# Patient Record
Sex: Female | Born: 1975 | Race: Black or African American | Hispanic: No | Marital: Single | Smoking: Never smoker
Health system: Southern US, Community
[De-identification: ages and names within clinical notes are randomized; demographics above are authoritative.]

## PROBLEM LIST (undated history)

## (undated) DIAGNOSIS — F909 Attention-deficit hyperactivity disorder, unspecified type: Secondary | ICD-10-CM

## (undated) DIAGNOSIS — F329 Major depressive disorder, single episode, unspecified: Secondary | ICD-10-CM

## (undated) DIAGNOSIS — Z87440 Personal history of urinary (tract) infections: Secondary | ICD-10-CM

## (undated) DIAGNOSIS — B009 Herpesviral infection, unspecified: Secondary | ICD-10-CM

## (undated) DIAGNOSIS — E7800 Pure hypercholesterolemia, unspecified: Secondary | ICD-10-CM

## (undated) DIAGNOSIS — N760 Acute vaginitis: Secondary | ICD-10-CM

## (undated) DIAGNOSIS — N3010 Interstitial cystitis (chronic) without hematuria: Secondary | ICD-10-CM

## (undated) DIAGNOSIS — F32A Depression, unspecified: Secondary | ICD-10-CM

## (undated) DIAGNOSIS — IMO0002 Reserved for concepts with insufficient information to code with codable children: Secondary | ICD-10-CM

## (undated) DIAGNOSIS — J45909 Unspecified asthma, uncomplicated: Secondary | ICD-10-CM

## (undated) DIAGNOSIS — G43909 Migraine, unspecified, not intractable, without status migrainosus: Secondary | ICD-10-CM

## (undated) DIAGNOSIS — K5909 Other constipation: Secondary | ICD-10-CM

## (undated) DIAGNOSIS — K219 Gastro-esophageal reflux disease without esophagitis: Secondary | ICD-10-CM

## (undated) DIAGNOSIS — B9689 Other specified bacterial agents as the cause of diseases classified elsewhere: Secondary | ICD-10-CM

## (undated) DIAGNOSIS — L42 Pityriasis rosea: Secondary | ICD-10-CM

## (undated) DIAGNOSIS — F419 Anxiety disorder, unspecified: Secondary | ICD-10-CM

## (undated) DIAGNOSIS — R87619 Unspecified abnormal cytological findings in specimens from cervix uteri: Secondary | ICD-10-CM

## (undated) DIAGNOSIS — F429 Obsessive-compulsive disorder, unspecified: Secondary | ICD-10-CM

## (undated) DIAGNOSIS — E119 Type 2 diabetes mellitus without complications: Secondary | ICD-10-CM

## (undated) HISTORY — DX: Unspecified abnormal cytological findings in specimens from cervix uteri: R87.619

## (undated) HISTORY — DX: Depression, unspecified: F32.A

## (undated) HISTORY — DX: Other specified bacterial agents as the cause of diseases classified elsewhere: N76.0

## (undated) HISTORY — PX: OTHER SURGICAL HISTORY: SHX169

## (undated) HISTORY — PX: ESOPHAGEAL DILATION: SHX303

## (undated) HISTORY — DX: Interstitial cystitis (chronic) without hematuria: N30.10

## (undated) HISTORY — DX: Herpesviral infection, unspecified: B00.9

## (undated) HISTORY — DX: Other specified bacterial agents as the cause of diseases classified elsewhere: B96.89

## (undated) HISTORY — DX: Pityriasis rosea: L42

## (undated) HISTORY — DX: Personal history of urinary (tract) infections: Z87.440

## (undated) HISTORY — DX: Type 2 diabetes mellitus without complications: E11.9

## (undated) HISTORY — DX: Reserved for concepts with insufficient information to code with codable children: IMO0002

## (undated) HISTORY — DX: Major depressive disorder, single episode, unspecified: F32.9

## (undated) HISTORY — DX: Pure hypercholesterolemia, unspecified: E78.00

---

## 2001-02-23 ENCOUNTER — Encounter: Payer: Self-pay | Admitting: Unknown Physician Specialty

## 2001-02-23 ENCOUNTER — Ambulatory Visit (HOSPITAL_COMMUNITY): Admission: RE | Admit: 2001-02-23 | Discharge: 2001-02-23 | Payer: Self-pay | Admitting: Unknown Physician Specialty

## 2001-03-31 ENCOUNTER — Emergency Department (HOSPITAL_COMMUNITY): Admission: EM | Admit: 2001-03-31 | Discharge: 2001-03-31 | Payer: Self-pay | Admitting: Emergency Medicine

## 2001-04-26 ENCOUNTER — Emergency Department (HOSPITAL_COMMUNITY): Admission: EM | Admit: 2001-04-26 | Discharge: 2001-04-26 | Payer: Self-pay | Admitting: Emergency Medicine

## 2001-04-26 ENCOUNTER — Encounter: Payer: Self-pay | Admitting: Emergency Medicine

## 2001-05-14 ENCOUNTER — Encounter: Payer: Self-pay | Admitting: Pain Medicine

## 2001-05-14 ENCOUNTER — Ambulatory Visit (HOSPITAL_COMMUNITY): Admission: RE | Admit: 2001-05-14 | Discharge: 2001-05-14 | Payer: Self-pay | Admitting: Pain Medicine

## 2001-11-28 ENCOUNTER — Emergency Department (HOSPITAL_COMMUNITY): Admission: EM | Admit: 2001-11-28 | Discharge: 2001-11-29 | Payer: Self-pay | Admitting: *Deleted

## 2002-12-18 ENCOUNTER — Emergency Department (HOSPITAL_COMMUNITY): Admission: EM | Admit: 2002-12-18 | Discharge: 2002-12-18 | Payer: Self-pay | Admitting: Emergency Medicine

## 2002-12-21 ENCOUNTER — Ambulatory Visit (HOSPITAL_COMMUNITY): Admission: RE | Admit: 2002-12-21 | Discharge: 2002-12-21 | Payer: Self-pay | Admitting: *Deleted

## 2003-06-28 ENCOUNTER — Emergency Department (HOSPITAL_COMMUNITY): Admission: EM | Admit: 2003-06-28 | Discharge: 2003-06-28 | Payer: Self-pay | Admitting: Emergency Medicine

## 2003-06-28 ENCOUNTER — Encounter: Payer: Self-pay | Admitting: *Deleted

## 2003-10-30 ENCOUNTER — Ambulatory Visit (HOSPITAL_COMMUNITY): Admission: AD | Admit: 2003-10-30 | Discharge: 2003-10-30 | Payer: Self-pay | Admitting: Obstetrics & Gynecology

## 2003-12-15 ENCOUNTER — Ambulatory Visit (HOSPITAL_COMMUNITY): Admission: AD | Admit: 2003-12-15 | Discharge: 2003-12-15 | Payer: Self-pay | Admitting: Obstetrics & Gynecology

## 2004-02-20 ENCOUNTER — Ambulatory Visit (HOSPITAL_COMMUNITY): Admission: RE | Admit: 2004-02-20 | Discharge: 2004-02-20 | Payer: Self-pay | Admitting: Obstetrics and Gynecology

## 2004-02-28 ENCOUNTER — Ambulatory Visit (HOSPITAL_COMMUNITY): Admission: AD | Admit: 2004-02-28 | Discharge: 2004-02-28 | Payer: Self-pay | Admitting: Obstetrics and Gynecology

## 2004-03-07 ENCOUNTER — Inpatient Hospital Stay (HOSPITAL_COMMUNITY): Admission: AD | Admit: 2004-03-07 | Discharge: 2004-03-10 | Payer: Self-pay | Admitting: Obstetrics and Gynecology

## 2004-12-07 ENCOUNTER — Ambulatory Visit (HOSPITAL_COMMUNITY): Admission: RE | Admit: 2004-12-07 | Discharge: 2004-12-07 | Payer: Self-pay | Admitting: Family Medicine

## 2006-09-27 ENCOUNTER — Emergency Department (HOSPITAL_COMMUNITY): Admission: EM | Admit: 2006-09-27 | Discharge: 2006-09-27 | Payer: Self-pay | Admitting: Emergency Medicine

## 2006-12-15 ENCOUNTER — Emergency Department (HOSPITAL_COMMUNITY): Admission: EM | Admit: 2006-12-15 | Discharge: 2006-12-15 | Payer: Self-pay | Admitting: Emergency Medicine

## 2006-12-23 ENCOUNTER — Ambulatory Visit (HOSPITAL_COMMUNITY): Admission: RE | Admit: 2006-12-23 | Discharge: 2006-12-23 | Payer: Self-pay | Admitting: Family Medicine

## 2008-01-04 ENCOUNTER — Other Ambulatory Visit: Admission: RE | Admit: 2008-01-04 | Discharge: 2008-01-04 | Payer: Self-pay | Admitting: Obstetrics & Gynecology

## 2008-12-26 ENCOUNTER — Other Ambulatory Visit: Admission: RE | Admit: 2008-12-26 | Discharge: 2008-12-26 | Payer: Self-pay | Admitting: Obstetrics & Gynecology

## 2009-08-28 ENCOUNTER — Ambulatory Visit (HOSPITAL_COMMUNITY): Admission: RE | Admit: 2009-08-28 | Discharge: 2009-08-28 | Payer: Self-pay | Admitting: Family Medicine

## 2009-12-19 ENCOUNTER — Encounter: Payer: Self-pay | Admitting: Orthopedic Surgery

## 2009-12-19 ENCOUNTER — Emergency Department (HOSPITAL_COMMUNITY): Admission: EM | Admit: 2009-12-19 | Discharge: 2009-12-19 | Payer: Self-pay | Admitting: Emergency Medicine

## 2009-12-26 ENCOUNTER — Ambulatory Visit: Payer: Self-pay | Admitting: Orthopedic Surgery

## 2009-12-26 DIAGNOSIS — IMO0002 Reserved for concepts with insufficient information to code with codable children: Secondary | ICD-10-CM | POA: Insufficient documentation

## 2009-12-26 DIAGNOSIS — M25539 Pain in unspecified wrist: Secondary | ICD-10-CM | POA: Insufficient documentation

## 2009-12-29 ENCOUNTER — Encounter (INDEPENDENT_AMBULATORY_CARE_PROVIDER_SITE_OTHER): Payer: Self-pay | Admitting: *Deleted

## 2010-01-03 ENCOUNTER — Ambulatory Visit (HOSPITAL_COMMUNITY): Admission: RE | Admit: 2010-01-03 | Discharge: 2010-01-03 | Payer: Self-pay | Admitting: Orthopedic Surgery

## 2010-01-09 ENCOUNTER — Ambulatory Visit: Payer: Self-pay | Admitting: Orthopedic Surgery

## 2010-01-22 ENCOUNTER — Ambulatory Visit: Payer: Self-pay | Admitting: Orthopedic Surgery

## 2010-01-22 ENCOUNTER — Telehealth: Payer: Self-pay | Admitting: Orthopedic Surgery

## 2010-01-22 DIAGNOSIS — M25569 Pain in unspecified knee: Secondary | ICD-10-CM

## 2010-01-30 ENCOUNTER — Encounter: Payer: Self-pay | Admitting: Orthopedic Surgery

## 2010-01-30 ENCOUNTER — Encounter (HOSPITAL_COMMUNITY)
Admission: RE | Admit: 2010-01-30 | Discharge: 2010-03-01 | Payer: Self-pay | Source: Home / Self Care | Admitting: Orthopedic Surgery

## 2010-03-12 ENCOUNTER — Telehealth: Payer: Self-pay | Admitting: Orthopedic Surgery

## 2010-04-19 ENCOUNTER — Encounter (HOSPITAL_COMMUNITY): Admission: RE | Admit: 2010-04-19 | Discharge: 2010-05-19 | Payer: Self-pay | Admitting: Orthopedic Surgery

## 2010-05-02 ENCOUNTER — Encounter: Payer: Self-pay | Admitting: Orthopedic Surgery

## 2010-05-15 ENCOUNTER — Other Ambulatory Visit: Admission: RE | Admit: 2010-05-15 | Discharge: 2010-05-15 | Payer: Self-pay | Admitting: Obstetrics & Gynecology

## 2010-05-18 ENCOUNTER — Encounter: Payer: Self-pay | Admitting: Orthopedic Surgery

## 2010-05-22 ENCOUNTER — Encounter (HOSPITAL_COMMUNITY)
Admission: RE | Admit: 2010-05-22 | Discharge: 2010-06-21 | Payer: Self-pay | Source: Home / Self Care | Admitting: Orthopedic Surgery

## 2010-06-19 ENCOUNTER — Encounter: Payer: Self-pay | Admitting: Orthopedic Surgery

## 2010-06-20 ENCOUNTER — Telehealth: Payer: Self-pay | Admitting: Orthopedic Surgery

## 2010-06-22 ENCOUNTER — Encounter (HOSPITAL_COMMUNITY)
Admission: RE | Admit: 2010-06-22 | Discharge: 2010-07-22 | Payer: Self-pay | Source: Home / Self Care | Admitting: Orthopedic Surgery

## 2010-07-26 ENCOUNTER — Encounter: Payer: Self-pay | Admitting: Orthopedic Surgery

## 2010-08-22 ENCOUNTER — Ambulatory Visit (HOSPITAL_COMMUNITY): Admission: RE | Admit: 2010-08-22 | Discharge: 2010-08-22 | Payer: Self-pay | Admitting: Obstetrics & Gynecology

## 2010-08-29 ENCOUNTER — Telehealth: Payer: Self-pay | Admitting: Orthopedic Surgery

## 2010-11-18 ENCOUNTER — Encounter: Payer: Self-pay | Admitting: Orthopedic Surgery

## 2010-11-27 NOTE — Progress Notes (Signed)
Summary: vicodin refill  Phone Note Call from Patient Call back at [facility] Phone [phone]   Summary of Call: wants refill on Vicodin 5, ok or not, has not been seen since March, was supposed to come back, did not, just wanted to run by you Initial call taken by: Ether Griffins,  June 20, 2010 8:34 AM  Follow-up for Phone Call        refill #42 2 refills Follow-up by: Fuller Canada MD,  June 20, 2010 8:36 AM    Prescriptions: VICODIN 5-500 MG TABS (HYDROCODONE-ACETAMINOPHEN) 1/2 to 1 by mouth q 4 as needed pain  #42 x 2   Entered by:   Ether Griffins   Authorized by:   Fuller Canada MD   Signed by:   Ether Griffins on 06/20/2010   Method used:   Handwritten   RxID:   [id number]

## 2010-11-27 NOTE — Assessment & Plan Note (Signed)
Summary: RE-CK KNEE IN BRACE/MEDICAID/CAF   Visit Type:  Follow-up Referring Provider:  emergency room  CC:  left knee and left wrist.  History of Present Illness: I saw Debra Lewis in the office today for a 1 month  followup visit.  She is a 35 years old woman with the complaint of:  left knee and left wrist pain.  MEDS:  1/2 vicodin zanaflex aleve   IMAGES: MRI 1.  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 2.   Otherwise, no significant abnormality identified.  Braces have not been fitting, We've tried several   she has some crepitance in the knee, which really bothers her. I had her walk down the hall. She has no subluxation of patella, statically, or dynamically, but the crepitance is making her feel like she does. Her knee is completely stable. She has some anterior knee pain. There is no swelling.  Recommend dry knee sleeve, which we did that seemed to fit better.  Recommend physical therapy for 6 weeks and recheck.  Allergies: 1)  ! Sulfa 2)  ! * Fagel   Other Orders: Physical Therapy Referral (PT) Est. Patient Level II (19147)  Patient Instructions: 1)  Physical therapy for 6 weeks 2)  continue bracing while walking 3)  come back in 6 weeks

## 2010-11-27 NOTE — Miscellaneous (Signed)
Summary: PT initial evaluation  PT initial evaluation   Imported By: Jacklynn Ganong 05/07/2010 08:14:34  _____________________________________________________________________  External Attachment:    Type:   Image     Comment:   External Document

## 2010-11-27 NOTE — Progress Notes (Signed)
Summary: sleeve is too big  Phone Note Call from Patient   Summary of Call: Debra Lewis (20-Oct-1976) said she pulled the medium sleeve all the way up on her thigh and when she walks it slides down. Her # [phone] Initial call taken by: Jacklynn Ganong,  January 22, 2010 2:39 PM  Follow-up for Phone Call        come in and try the smaller one  Follow-up by: Fuller Canada MD,  January 22, 2010 2:43 PM  Additional Follow-up for Phone Call Additional follow up Details #1::        Advised patient to come in this afternoon to try smaller sleeve Additional Follow-up by: Jacklynn Ganong,  January 22, 2010 3:03 PM

## 2010-11-27 NOTE — Progress Notes (Signed)
Summary: patient to call back to re-schedule appointment  Phone Note Call from Patient   Caller: Patient Summary of Call: Patient has called needing to re-schedule her fol/up appt after completing physical therapy (previously scheduled 10/17,10/24,/08/27/10), first 2 which had to be re-scheduled due to son's illness, then re-sched'd d/t a surgical procedure she has just recently had.  She will call to re-schedule asap. Initial call taken by: Cammie Sickle,  August 29, 2010 10:11 AM
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
## 2010-11-27 NOTE — Letter (Signed)
Summary: Medical record request Atty R.Clyda Hurdle  Medical record request Atty R.Clyda Hurdle   Imported By: Cammie Sickle 05/22/2010 17:59:02  _____________________________________________________________________  External Attachment:    Type:   Image     Comment:   External Document

## 2010-11-27 NOTE — Letter (Signed)
Summary: History form  History form   Imported By: Jacklynn Ganong 01/02/2010 09:54:46  _____________________________________________________________________  External Attachment:    Type:   Image     Comment:   External Document

## 2010-11-27 NOTE — Miscellaneous (Signed)
Summary: PT Progress note  PT Progress note   Imported By: Jacklynn Ganong 06/19/2010 11:27:00  _____________________________________________________________________  External Attachment:    Type:   Image     Comment:   External Document

## 2010-11-27 NOTE — Miscellaneous (Signed)
Summary: mri aph 01/03/10 940am left knee  Clinical Lists Changes   Medicaid [place] precert number [id number] expires 01/28/10, aph mri left knee, to come back 01/09/10 for results in our office, to bring disc.

## 2010-11-27 NOTE — Miscellaneous (Signed)
Summary: PT Initial evaluation  PT Initial evaluation   Imported By: Jacklynn Ganong 02/13/2010 09:11:38  _____________________________________________________________________  External Attachment:    Type:   Image     Comment:   External Document
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
## 2010-11-27 NOTE — Miscellaneous (Signed)
Summary: PT Discharge summary  PT Discharge summary   Imported By: Jacklynn Ganong 07/30/2010 08:14:26  _____________________________________________________________________  External Attachment:    Type:   Image     Comment:   External Document

## 2010-11-27 NOTE — Progress Notes (Signed)
Summary: cancelled today's app't  Phone Note Call from Patient   Summary of Call: Debra Lewis cancelled today's appointment due to her son having pneumonia.  He has been in the hospital and is home now but still very sick.  Said she will reschedule when her son is better.  Has not been able to go to PT, but has spoken with them and the PT has been put on hold until after she sees you again.  Said she has been doing the exercises at home. Initial call taken by: Jacklynn Ganong,  Mar 12, 2010 8:12 AM

## 2011-01-09 LAB — COMPREHENSIVE METABOLIC PANEL
BUN: 4 mg/dL — ABNORMAL LOW (ref 6–23)
CO2: 25 mEq/L (ref 19–32)
Calcium: 9.1 mg/dL (ref 8.4–10.5)
GFR calc Af Amer: >60 mL/min (ref >60)
GFR calc non Af Amer: >60 mL/min (ref >60)
Glucose, Bld: 90 mg/dL (ref 70–99)
Potassium: 4.7 mEq/L (ref 3.5–5.1)
Sodium: 139 mEq/L (ref 135–145)
Total Bilirubin: 0.4 mg/dL (ref 0.3–1.2)

## 2011-01-09 LAB — URINALYSIS, ROUTINE W REFLEX MICROSCOPIC
Glucose, UA: NEGATIVE mg/dL
Hgb urine dipstick: NEGATIVE
Ketones, ur: NEGATIVE mg/dL
Protein, ur: NEGATIVE mg/dL

## 2011-01-09 LAB — CBC
Hemoglobin: 12.3 g/dL (ref 12.0–15.0)
MCH: 27 pg (ref 26.0–34.0)
Platelets: 341 10*3/uL (ref 150–400)
RBC: 4.54 MIL/uL (ref 3.87–5.11)
RDW: 15.5 % (ref 11.5–15.5)
WBC: 7.3 10*3/uL (ref 4.0–10.5)

## 2011-01-09 LAB — HCG, QUANTITATIVE, PREGNANCY: hCG, Beta Chain, Quant, S: <2 m[IU]/mL (ref <5)

## 2011-03-15 NOTE — Op Note (Signed)
NAME:  [name], [name]                        ACCOUNT NO.:  [id number]   MEDICAL RECORD NO.:  [id number]                   PATIENT TYPE:  OIB   LOCATION:  A428                                 FACILITY:  [facility]   PHYSICIAN:  Tilda Burrow, M.D.              DATE OF BIRTH:  06/05/1976   DATE OF PROCEDURE:  DATE OF DISCHARGE:  02/28/2004                                 OPERATIVE REPORT   [name] progressed nicely in labor.  The epidural was placed earlier in the  labor process.  She reached 7 cm rather quickly.  As we noted earlier, the  epidural catheter placement was somewhat challenging.  She had cessation of  epidural catheter benefits, received an additional bolus of 7 cc of 0.125%  Marcaine on two consecutive doses, approximately 5 minutes apart.  She did  not get any additional relief.  She reached completed dilated, moving  quickly from 7 cm.  Upon beginning to push, there was a brief second stage  notable initially for fetal bradycardia.  We were able to assist with  rotating the vertex manually and it rotated into the right occiput anterior  position and delivered over a small second degree laceration secondary to  episiotomy in the midline, delivering a 6 pound 1.1 ounce female infant,  Apgars 9 and 9, with amniotic fluid clear, bulb suctioning of the pharynx  performed before delivering the rest of the body and easy delivery of the  infant.   The cord was clamped after the infant was placed on the maternal abdomen.  The infant was then placed in the warmer.  The placenta delivered  subsequently.  The cord began to separate from the placenta so we were able  to use ring forceps to grab the leading edge of the placenta.  Once the  placenta was delivered, we were able to invert the placenta and recognize  that she had a bilateral-valved placenta with two separate lobes of the  placenta, each one greater than 10 cm in diameter with a velamentous  insertion of cord and several  arteries and veins traversing across the large  space between the two placental bodies.  One of these large veins and  adjacent artery ran within 2 cm of the opening in the membrane where the  membranes had ruptured.  There were no major vessels that could be  identified and reached at the opening in the placental membrane.  We showed  this to the family.  The placenta will be sent for histology.  The mother  and infant did well with excellent support with three support persons in the  room.      ___________________________________________                                            Debra Ruiz  Benancio Lewis, M.D.   JVF/MEDQ  D:  03/07/2004  T:  03/08/2004  Job:  [id number]   cc:   Joette Catching, M.D.

## 2011-03-15 NOTE — Consult Note (Signed)
NAME:  Debra, Lewis                        ACCOUNT NO.:  [id number]   MEDICAL RECORD NO.:  [id number]                   PATIENT TYPE:  OIB   LOCATION:  A418                                 FACILITY:  [facility]   PHYSICIAN:  Lazaro Arms, M.D.                DATE OF BIRTH:  1975-11-18   DATE OF CONSULTATION:  DATE OF DISCHARGE:  02/20/2004                                   CONSULTATION   Debra Lewis is a 35 year old, gravida 1, para 0, at [redacted] weeks gestation who  presented to labor and delivery complaining of lower abdominal pain and  contractions. She is having infrequent contractions, maybe about three in 45  minutes. Her cervix is long, thick, and closed, posterior and soft. She has  reactive NST. She has had no bleeding and no gushes of fluid.   IMPRESSION:  This represents false labor. She is discharged to home. She is  to keep her regular  appointment and given aftercare instructions.      ___________________________________________                                            Lazaro Arms, M.D.   Loraine Maple  D:  02/20/2004  T:  02/21/2004  Job:  [id number]
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
## 2011-03-15 NOTE — Op Note (Signed)
NAME:  Debra Lewis, Debra Lewis                        ACCOUNT NO.:  [id number]   MEDICAL RECORD NO.:  [id number]                   PATIENT TYPE:  OIB   LOCATION:  A428                                 FACILITY:  [facility]   PHYSICIAN:  Tilda Burrow, M.D.              DATE OF BIRTH:  1976/09/02   DATE OF PROCEDURE:  DATE OF DISCHARGE:  02/28/2004                                 OPERATIVE REPORT   PROCEDURE:  Epidural catheter placement.   DESCRIPTION OF PROCEDURE:  A continuous epidural catheter was placed in the  L2-3 interspace using loss-of-resistance technique.  After prepping and  draping in the bed, we were able to identify the interspace and were able to  gradually advance the needle.  There was some bony contact but we were able  to ease the needle in at a distance of about 8 cm.  Even though there was  not a distinct pop through the interspinous ligament, we were able to  identify what appeared to be loss-of-resistance.  There was no fluid  aspirated so the catheter was advanced to a distance of 4 cm into the  epidural space and a 5 mL test dose of 1.5% Xylocaine with epinephrine  instilled and then the catheter was taped to the back and attached to  position a bolus of 10 mL of the Marcaine solution.  The patient had  symmetric increase in sensation in her thighs and hips and contractions  became uncomfortable.  A 212 level was noted.  The patient did not have any  hypotensive episodes and will be monitored for continued progress with  Pitocin augmentation of labor in place at 4 munits at this time.      ___________________________________________                                            Tilda Burrow, M.D.   JVF/MEDQ  D:  03/07/2004  T:  03/07/2004  Job:  [id number]

## 2011-11-13 ENCOUNTER — Ambulatory Visit: Payer: Self-pay | Admitting: Orthopedic Surgery

## 2011-12-19 ENCOUNTER — Encounter: Payer: Self-pay | Admitting: Orthopedic Surgery

## 2011-12-19 ENCOUNTER — Ambulatory Visit (INDEPENDENT_AMBULATORY_CARE_PROVIDER_SITE_OTHER): Payer: Medicaid Other | Admitting: Orthopedic Surgery

## 2011-12-19 DIAGNOSIS — Z87828 Personal history of other (healed) physical injury and trauma: Secondary | ICD-10-CM

## 2011-12-19 DIAGNOSIS — M25569 Pain in unspecified knee: Secondary | ICD-10-CM

## 2011-12-19 DIAGNOSIS — Z8739 Personal history of other diseases of the musculoskeletal system and connective tissue: Secondary | ICD-10-CM

## 2011-12-19 NOTE — Patient Instructions (Signed)
Sign release to lawyer
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
## 2013-02-13 ENCOUNTER — Encounter (HOSPITAL_COMMUNITY): Payer: Self-pay | Admitting: Emergency Medicine

## 2013-02-13 ENCOUNTER — Emergency Department (HOSPITAL_COMMUNITY): Payer: Medicaid Other

## 2013-02-13 ENCOUNTER — Emergency Department (HOSPITAL_COMMUNITY)
Admission: EM | Admit: 2013-02-13 | Discharge: 2013-02-14 | Disposition: A | Discharge status: Home / Self Care | Payer: Medicaid Other | Attending: Emergency Medicine | Admitting: Emergency Medicine

## 2013-02-13 DIAGNOSIS — R209 Unspecified disturbances of skin sensation: Secondary | ICD-10-CM | POA: Insufficient documentation

## 2013-02-13 DIAGNOSIS — R0789 Other chest pain: Secondary | ICD-10-CM

## 2013-02-13 DIAGNOSIS — F909 Attention-deficit hyperactivity disorder, unspecified type: Secondary | ICD-10-CM | POA: Insufficient documentation

## 2013-02-13 DIAGNOSIS — J45909 Unspecified asthma, uncomplicated: Secondary | ICD-10-CM | POA: Insufficient documentation

## 2013-02-13 DIAGNOSIS — Z8742 Personal history of other diseases of the female genital tract: Secondary | ICD-10-CM | POA: Insufficient documentation

## 2013-02-13 DIAGNOSIS — Z87440 Personal history of urinary (tract) infections: Secondary | ICD-10-CM | POA: Insufficient documentation

## 2013-02-13 DIAGNOSIS — R071 Chest pain on breathing: Secondary | ICD-10-CM | POA: Insufficient documentation

## 2013-02-13 DIAGNOSIS — F429 Obsessive-compulsive disorder, unspecified: Secondary | ICD-10-CM | POA: Insufficient documentation

## 2013-02-13 DIAGNOSIS — R51 Headache: Secondary | ICD-10-CM | POA: Insufficient documentation

## 2013-02-13 DIAGNOSIS — Z8619 Personal history of other infectious and parasitic diseases: Secondary | ICD-10-CM | POA: Insufficient documentation

## 2013-02-13 DIAGNOSIS — M62838 Other muscle spasm: Secondary | ICD-10-CM | POA: Insufficient documentation

## 2013-02-13 DIAGNOSIS — Z79899 Other long term (current) drug therapy: Secondary | ICD-10-CM | POA: Insufficient documentation

## 2013-02-13 DIAGNOSIS — R61 Generalized hyperhidrosis: Secondary | ICD-10-CM | POA: Insufficient documentation

## 2013-02-13 DIAGNOSIS — H53149 Visual discomfort, unspecified: Secondary | ICD-10-CM | POA: Insufficient documentation

## 2013-02-13 DIAGNOSIS — Z8679 Personal history of other diseases of the circulatory system: Secondary | ICD-10-CM | POA: Insufficient documentation

## 2013-02-13 DIAGNOSIS — F411 Generalized anxiety disorder: Secondary | ICD-10-CM | POA: Insufficient documentation

## 2013-02-13 HISTORY — DX: Attention-deficit hyperactivity disorder, unspecified type: F90.9

## 2013-02-13 HISTORY — DX: Unspecified asthma, uncomplicated: J45.909

## 2013-02-13 HISTORY — DX: Obsessive-compulsive disorder, unspecified: F42.9

## 2013-02-13 HISTORY — DX: Migraine, unspecified, not intractable, without status migrainosus: G43.909

## 2013-02-13 HISTORY — DX: Anxiety disorder, unspecified: F41.9

## 2013-02-13 LAB — BASIC METABOLIC PANEL
BUN: 7 mg/dL (ref 6–23)
Chloride: 104 mEq/L (ref 96–112)
Creatinine, Ser: 0.85 mg/dL (ref 0.50–1.10)
GFR calc Af Amer: >90 mL/min (ref >90)
Glucose, Bld: 83 mg/dL (ref 70–99)

## 2013-02-13 LAB — CBC
HCT: 40.8 % (ref 36.0–46.0)
Hemoglobin: 13.7 g/dL (ref 12.0–15.0)
MCHC: 33.6 g/dL (ref 30.0–36.0)
MCV: 82.8 fL (ref 78.0–100.0)
RDW: 14.5 % (ref 11.5–15.5)
WBC: 8.8 10*3/uL (ref 4.0–10.5)

## 2013-02-13 MED ORDER — IBUPROFEN 600 MG PO TABS: 600.0000 mg | ORAL_TABLET | Freq: Four times a day (QID) | ORAL | Status: DC | PRN

## 2013-02-13 MED ORDER — CYCLOBENZAPRINE HCL 5 MG PO TABS: 5.0000 mg | ORAL_TABLET | Freq: Three times a day (TID) | ORAL | Status: DC | PRN

## 2013-02-13 MED ORDER — METOCLOPRAMIDE HCL 5 MG/ML IJ SOLN
10.0000 mg | Freq: Once | INTRAMUSCULAR | Status: AC
  Administered 2013-02-13: 10 mg via INTRAVENOUS
  Filled 2013-02-13: qty 2

## 2013-02-13 MED ORDER — METHOCARBAMOL 500 MG PO TABS
1000.0000 mg | ORAL_TABLET | Freq: Once | ORAL | Status: AC
  Administered 2013-02-13: 1000 mg via ORAL
  Filled 2013-02-13: qty 2

## 2013-02-13 MED ORDER — KETOROLAC TROMETHAMINE 30 MG/ML IJ SOLN
30.0000 mg | Freq: Once | INTRAMUSCULAR | Status: AC
  Administered 2013-02-13: 30 mg via INTRAVENOUS
  Filled 2013-02-13: qty 1

## 2013-02-13 MED ORDER — DIPHENHYDRAMINE HCL 50 MG/ML IJ SOLN
25.0000 mg | Freq: Once | INTRAMUSCULAR | Status: AC
  Administered 2013-02-13: 25 mg via INTRAVENOUS
  Filled 2013-02-13: qty 1
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
## 2013-02-13 NOTE — ED Notes (Signed)
Patient complaining of central chest pain and right arm numbness and tingling that started today. Reports has had symptoms before.

## 2013-02-16 ENCOUNTER — Encounter: Payer: Self-pay | Admitting: *Deleted

## 2013-02-17 ENCOUNTER — Ambulatory Visit: Payer: Self-pay | Admitting: Obstetrics & Gynecology

## 2013-02-25 ENCOUNTER — Encounter: Payer: Self-pay | Admitting: Obstetrics & Gynecology

## 2013-02-25 ENCOUNTER — Ambulatory Visit (INDEPENDENT_AMBULATORY_CARE_PROVIDER_SITE_OTHER): Payer: Medicaid Other | Admitting: Obstetrics & Gynecology

## 2013-02-25 VITALS — BP 100/80 | Wt 121.0 lb

## 2013-02-25 DIAGNOSIS — N3010 Interstitial cystitis (chronic) without hematuria: Secondary | ICD-10-CM

## 2013-02-25 NOTE — Patient Instructions (Signed)

## 2013-03-03 DIAGNOSIS — N3010 Interstitial cystitis (chronic) without hematuria: Secondary | ICD-10-CM | POA: Insufficient documentation

## 2013-03-03 NOTE — Progress Notes (Signed)
Patient ID: Debra Lewis, female   DOB: 10-24-1976, 37 y.o.   MRN: [id number] In for evaluation and therapy of her IC Last irrigation was 7 weeks age  Bladder been doing ok, no significant complaints  Blood pressure 100/80, weight 121 lb (54.885 kg), last menstrual period 02/03/2013.  Urethra prepped DMSO 50 cc irrigated into bladder without problems  Follow up in 6 weeks

## 2013-04-01 ENCOUNTER — Other Ambulatory Visit: Payer: Self-pay | Admitting: Obstetrics and Gynecology

## 2013-04-16 ENCOUNTER — Ambulatory Visit (INDEPENDENT_AMBULATORY_CARE_PROVIDER_SITE_OTHER): Payer: Medicaid Other | Admitting: Obstetrics & Gynecology

## 2013-04-16 ENCOUNTER — Encounter: Payer: Self-pay | Admitting: Obstetrics & Gynecology

## 2013-04-16 VITALS — BP 110/70 | Temp 98.3°F | Wt 124.0 lb

## 2013-04-16 DIAGNOSIS — R319 Hematuria, unspecified: Secondary | ICD-10-CM

## 2013-04-16 DIAGNOSIS — M549 Dorsalgia, unspecified: Secondary | ICD-10-CM

## 2013-04-16 DIAGNOSIS — N390 Urinary tract infection, site not specified: Secondary | ICD-10-CM | POA: Insufficient documentation

## 2013-04-16 LAB — POCT URINALYSIS DIPSTICK
Blood, UA: 3
Nitrite, UA: NEGATIVE

## 2013-04-16 MED ORDER — CIPROFLOXACIN HCL 500 MG PO TABS: 500.0000 mg | ORAL_TABLET | Freq: Two times a day (BID) | ORAL | Status: DC

## 2013-04-16 NOTE — Progress Notes (Signed)
Patient ID: Debra Lewis, female   DOB: 1976-05-18, 37 y.o.   MRN: [id number] Past Medical History  Diagnosis Date  . Anxiety   . Asthma   . Migraines   . HSV-2 (herpes simplex virus 2) infection   . Abnormal pap   . History of frequent urinary tract infections   . Bacterial vaginosis   . Interstitial cystitis   . OCD (obsessive compulsive disorder)   . ADHD (attention deficit hyperactivity disorder)    Past Surgical History  Procedure Laterality Date  . Conization of cervix     G1P1  ROS negative except per HPI  HPI  [name] is in today complaining of feels like urinary tract symptoms which began last Saturday 6 days ago She then began having lower back and left lower quadrant pain Wednesday She came in today for evaluation Urine is a bit mixed no nitrites little bit of blood microscopically and some white cells  Exam reveals no musculoskeletal pain in the back Pelvic is negative cervical motion tenderness left adnexa slightly tender but normal size moves normally the left ovaries normal right ovaries normal  Impression: Possible UTI in a patient with chronic interstitial cystitis  Plan culture urine Scalpel and give her prescription for Cipro to take with her history I guess is probably most likely scenariobut if does not resolve will proceed with a pelvic sonogram

## 2013-04-16 NOTE — Addendum Note (Signed)
Addended by: Richardson Chiquito on: 04/16/2013 10:10 AM   Modules accepted: Orders

## 2013-04-16 NOTE — Patient Instructions (Addendum)
Urinary Tract Infection  Urinary tract infections (UTIs) can develop anywhere along your urinary tract. Your urinary tract is your body's drainage system for removing wastes and extra water. Your urinary tract includes two kidneys, two ureters, a bladder, and a urethra. Your kidneys are a pair of bean-shaped organs. Each kidney is about the size of your fist. They are located below your ribs, one on each side of your spine.  CAUSES  Infections are caused by microbes, which are microscopic organisms, including fungi, viruses, and bacteria. These organisms are so small that they can only be seen through a microscope. Bacteria are the microbes that most commonly cause UTIs.  SYMPTOMS   Symptoms of UTIs may vary by age and gender of the patient and by the location of the infection. Symptoms in young women typically include a frequent and intense urge to urinate and a painful, burning feeling in the bladder or urethra during urination. Older women and men are more likely to be tired, shaky, and weak and have muscle aches and abdominal pain. A fever may mean the infection is in your kidneys. Other symptoms of a kidney infection include pain in your back or sides below the ribs, nausea, and vomiting.  DIAGNOSIS  To diagnose a UTI, your caregiver will ask you about your symptoms. Your caregiver also will ask to provide a urine sample. The urine sample will be tested for bacteria and white blood cells. White blood cells are made by your body to help fight infection.  TREATMENT   Typically, UTIs can be treated with medication. Because most UTIs are caused by a bacterial infection, they usually can be treated with the use of antibiotics. The choice of antibiotic and length of treatment depend on your symptoms and the type of bacteria causing your infection.  HOME CARE INSTRUCTIONS   If you were prescribed antibiotics, take them exactly as your caregiver instructs you. Finish the medication even if you feel better after you  have only taken some of the medication.   Drink enough water and fluids to keep your urine clear or pale yellow.   Avoid caffeine, tea, and carbonated beverages. They tend to irritate your bladder.   Empty your bladder often. Avoid holding urine for long periods of time.   Empty your bladder before and after sexual intercourse.   After a bowel movement, women should cleanse from front to back. Use each tissue only once.  SEEK MEDICAL CARE IF:    You have back pain.   You develop a fever.   Your symptoms do not begin to resolve within 3 days.  SEEK IMMEDIATE MEDICAL CARE IF:    You have severe back pain or lower abdominal pain.   You develop chills.   You have nausea or vomiting.   You have continued burning or discomfort with urination.  MAKE SURE YOU:    Understand these instructions.   Will watch your condition.   Will get help right away if you are not doing well or get worse.  Document Released: 07/24/2005 Document Revised: 04/14/2012 Document Reviewed: 11/22/2011  ExitCare Patient Information 2014 [employer]

## 2013-04-19 LAB — URINE CULTURE: Colony Count: >=100000

## 2013-04-27 ENCOUNTER — Ambulatory Visit: Payer: Medicaid Other | Admitting: Obstetrics & Gynecology

## 2013-05-11 ENCOUNTER — Encounter: Payer: Self-pay | Admitting: Obstetrics & Gynecology

## 2013-05-11 ENCOUNTER — Ambulatory Visit (INDEPENDENT_AMBULATORY_CARE_PROVIDER_SITE_OTHER): Payer: Medicaid Other | Admitting: Obstetrics & Gynecology

## 2013-05-11 VITALS — BP 120/80 | Wt 123.0 lb

## 2013-05-11 DIAGNOSIS — N3010 Interstitial cystitis (chronic) without hematuria: Secondary | ICD-10-CM

## 2013-05-11 LAB — POCT URINALYSIS DIPSTICK
Blood, UA: NEGATIVE
Glucose, UA: NEGATIVE
Leukocytes, UA: NEGATIVE
Nitrite, UA: NEGATIVE

## 2013-05-11 NOTE — Progress Notes (Signed)
Patient ID: Debra Lewis, female   DOB: 27-Jun-1976, 37 y.o.   MRN: [id number] Debra Lewis is in for treatment for her chronic interstitial cystitis She had DMSO treatments one month ago Her urine today is clear  50 cc DMSO is instilledl there is minimal post what residual  We will see her back in one month for repeat DMSO

## 2013-05-11 NOTE — Addendum Note (Signed)
Addended by: Richardson Chiquito on: 05/11/2013 04:46 PM   Modules accepted: Orders

## 2013-06-10 ENCOUNTER — Ambulatory Visit: Payer: Medicaid Other | Admitting: Obstetrics & Gynecology

## 2013-06-18 ENCOUNTER — Ambulatory Visit: Payer: Medicaid Other | Admitting: Obstetrics & Gynecology

## 2013-06-29 ENCOUNTER — Encounter: Payer: Self-pay | Admitting: Obstetrics & Gynecology

## 2013-06-29 ENCOUNTER — Ambulatory Visit (INDEPENDENT_AMBULATORY_CARE_PROVIDER_SITE_OTHER): Payer: Medicaid Other | Admitting: Obstetrics & Gynecology

## 2013-06-29 VITALS — BP 120/80 | Wt 124.0 lb

## 2013-06-29 DIAGNOSIS — Z3202 Encounter for pregnancy test, result negative: Secondary | ICD-10-CM

## 2013-06-29 DIAGNOSIS — Z3200 Encounter for pregnancy test, result unknown: Secondary | ICD-10-CM

## 2013-06-29 DIAGNOSIS — N3010 Interstitial cystitis (chronic) without hematuria: Secondary | ICD-10-CM

## 2013-06-29 LAB — POCT URINE PREGNANCY: Preg Test, Ur: NEGATIVE

## 2013-06-29 NOTE — Addendum Note (Signed)
Addended by: Richardson Chiquito on: 06/29/2013 03:29 PM   Modules accepted: Orders

## 2013-06-29 NOTE — Progress Notes (Signed)
Patient ID: Debra Lewis, female   DOB: [birth year], 37 y.o.   MRN: [id number] Patient ID: Debra Lewis, female   DOB: October 18, 1976, 37 y.o.   MRN: [id number] Debra Lewis is in for treatment for her chronic interstitial cystitis She had DMSO treatments one month ago Her urine today is clear  50 cc DMSO is instilledl there is minimal post what residual  We will see her back in one month for repeat DMSO

## 2013-07-28 ENCOUNTER — Ambulatory Visit (INDEPENDENT_AMBULATORY_CARE_PROVIDER_SITE_OTHER): Payer: 59 | Admitting: Psychology

## 2013-07-28 DIAGNOSIS — F411 Generalized anxiety disorder: Secondary | ICD-10-CM

## 2013-07-28 DIAGNOSIS — F329 Major depressive disorder, single episode, unspecified: Secondary | ICD-10-CM

## 2013-08-11 ENCOUNTER — Ambulatory Visit (INDEPENDENT_AMBULATORY_CARE_PROVIDER_SITE_OTHER): Payer: 59 | Admitting: Psychology

## 2013-08-11 DIAGNOSIS — F329 Major depressive disorder, single episode, unspecified: Secondary | ICD-10-CM

## 2013-08-11 DIAGNOSIS — F411 Generalized anxiety disorder: Secondary | ICD-10-CM

## 2013-08-12 ENCOUNTER — Encounter (INDEPENDENT_AMBULATORY_CARE_PROVIDER_SITE_OTHER): Payer: Self-pay

## 2013-08-12 ENCOUNTER — Ambulatory Visit (INDEPENDENT_AMBULATORY_CARE_PROVIDER_SITE_OTHER): Payer: Medicaid Other | Admitting: Obstetrics & Gynecology

## 2013-08-12 ENCOUNTER — Encounter: Payer: Self-pay | Admitting: Obstetrics & Gynecology

## 2013-08-12 VITALS — BP 100/72 | Ht 59.0 in | Wt 122.0 lb

## 2013-08-12 DIAGNOSIS — N3010 Interstitial cystitis (chronic) without hematuria: Secondary | ICD-10-CM

## 2013-08-12 DIAGNOSIS — Z3202 Encounter for pregnancy test, result negative: Secondary | ICD-10-CM

## 2013-08-12 NOTE — Progress Notes (Signed)
Patient ID: AGAM TUOHY, female   DOB: 06-19-1976, 37 y.o.   MRN: [id number] Patient ID: ASHANTAE PANGALLO, female   DOB: October 01, 1976, 37 y.o.   MRN: [id number] Patient ID: KAMIKA GOODLOE, female   DOB: February 03, 1976, 37 y.o.   MRN: [id number] [name] is in for treatment for her chronic interstitial cystitis She had DMSO treatments one month ago Her urine today is clear  50 cc DMSO is instilledl there is minimal post what residual  We will see her back in 6 weeks for repeat DMSO

## 2013-08-26 ENCOUNTER — Ambulatory Visit (HOSPITAL_COMMUNITY): Payer: Self-pay | Admitting: Psychology

## 2013-09-07 ENCOUNTER — Ambulatory Visit (INDEPENDENT_AMBULATORY_CARE_PROVIDER_SITE_OTHER): Payer: 59 | Admitting: Psychology

## 2013-09-07 DIAGNOSIS — F3289 Other specified depressive episodes: Secondary | ICD-10-CM

## 2013-09-07 DIAGNOSIS — F329 Major depressive disorder, single episode, unspecified: Secondary | ICD-10-CM

## 2013-09-07 DIAGNOSIS — F411 Generalized anxiety disorder: Secondary | ICD-10-CM

## 2013-09-21 ENCOUNTER — Ambulatory Visit: Payer: Medicaid Other | Admitting: Obstetrics & Gynecology

## 2013-09-29 ENCOUNTER — Other Ambulatory Visit: Payer: Self-pay | Admitting: Obstetrics & Gynecology

## 2013-09-29 ENCOUNTER — Ambulatory Visit (HOSPITAL_COMMUNITY): Payer: Self-pay | Admitting: Psychology

## 2013-10-06 ENCOUNTER — Ambulatory Visit (INDEPENDENT_AMBULATORY_CARE_PROVIDER_SITE_OTHER): Payer: Medicaid Other | Admitting: Obstetrics & Gynecology

## 2013-10-06 ENCOUNTER — Encounter: Payer: Self-pay | Admitting: Obstetrics & Gynecology

## 2013-10-06 VITALS — BP 108/72 | Ht 59.0 in | Wt 122.0 lb

## 2013-10-06 DIAGNOSIS — N3010 Interstitial cystitis (chronic) without hematuria: Secondary | ICD-10-CM

## 2013-10-06 NOTE — Progress Notes (Signed)
Patient ID: Debra Lewis, female   DOB: Oct 22, 1976, 37 y.o.   MRN: [id number] Patient ID: Debra Lewis, female   DOB: [birth year], 37 y.o.   MRN: [id number] Patient ID: Debra Lewis, female   DOB: [birth year], 37 y.o.   MRN: [id number] Patient ID: Debra Lewis, female   DOB: 1976/07/05, 37 y.o.   MRN: [id number] Debra Lewis is in for treatment for her chronic interstitial cystitis She had DMSO treatments one month ago Her urine today is clear  50 cc DMSO is instilledl there is minimal post what residual  We will see her back in 6 weeks for repeat DMSO

## 2013-10-08 ENCOUNTER — Other Ambulatory Visit: Payer: Self-pay | Admitting: Obstetrics & Gynecology

## 2013-10-14 ENCOUNTER — Other Ambulatory Visit: Payer: Self-pay | Admitting: Obstetrics & Gynecology

## 2013-10-15 ENCOUNTER — Ambulatory Visit (HOSPITAL_COMMUNITY): Payer: Self-pay | Admitting: Psychology

## 2013-10-15 MED ORDER — URIBEL 118 MG PO CAPS: 1.0000 | ORAL_CAPSULE | Freq: Four times a day (QID) | ORAL | Status: DC

## 2013-11-17 ENCOUNTER — Encounter (HOSPITAL_COMMUNITY): Payer: Self-pay | Admitting: Psychology

## 2013-11-17 NOTE — Progress Notes (Signed)
Patient:  Debra Lewis   DOB: 10/30/1975  MR Number: [id number]  [facility]Debra Lewis[address] [place] [address] Dept: [phone]  Start: 1 PM End: 2 PM  Provider/Observer:     Hershal CoriaJohn R Treavon Castilleja PSYD  Chief Complaint:      Chief Complaint  Patient presents with  . Anxiety  . Depression  . Stress    Reason For Service:     The patient was referred because of ongoing issues of anxiety and current stressors and depression. The patient reports that she was in a 20 year relationship that recently ended in that she moved back to the area to move in with her parents. The patient reports that there are major stressors due to the fact that her father is a crack cocaine abuser and the patient is in constant state of trying to help him with little success. The patient reports that there is a lot of conflict between her and her ex and they have a son together and the patient reports that her son worries all the time about the situation the patient is in a situation she has to take care of him.   Interventions Strategy:   cognitive/behavioral psychotherapy   Participation Level:   Active  Participation Quality:  Appropriate      Behavioral Observation:  Well Groomed, Alert, and Appropriate.   Current Psychosocial Factors:  The patient reports that she has been coping little bit better with the overall situation that she has been. The patient reports that she has had difficulty with coping recently..   Content of Session:    review current symptoms and worked on therapeutic interventions are in issues of anxiety and depression and coping issues.   Current Status:   patient describes significant symptoms of anxiety, worry, attentional problems, and depression.   Patient Progress:    stable   Target Goals:    target goals include reducing the intensity, severity, and duration of anxiety  symptoms as well as depressive symptoms.   Last Reviewed:    08/11/2013   Goals Addressed Today:    today we worked on Producer, television/film/videobuilding coping skills utilizing cognitive/behavioral therapeutic interventions for issues of depression and anxiety.   Impression/Diagnosis:    the patient has a long history of anxiety and depression as well as attentional problems. She has been taking both antianxiety medications, antidepressant medications as well as psychostimulant medications in the past.   Diagnosis:    Axis I: Generalized anxiety disorder  Depressive disorder, not elsewhere classified

## 2013-11-17 NOTE — Progress Notes (Signed)
Patient:  Debra Lewis   DOB: 09/28/1976  MR Number: [id number]  [facility] [address] [place] [address] Dept: [phone]  Start: 11 AM End: 12 PM  Provider/Observer:     Hershal CoriaJohn R Rodenbough PSYD  Chief Complaint:      Chief Complaint  Patient presents with  . Depression  . Anxiety  . Stress    Reason For Service:     The patient was referred because of ongoing issues of anxiety and current stressors and depression. The patient reports that she was in a 20 year relationship that recently ended in that she moved back to the area to move in with her parents. The patient reports that there are major stressors due to the fact that her father is a crack cocaine abuser and the patient is in constant state of trying to help him with little success. The patient reports that there is a lot of conflict between her and her ex and they have a son together and the patient reports that her son worries all the time about the situation the patient is in a situation she has to take care of him.   Interventions Strategy:   cognitive/behavioral psychotherapy   Participation Level:   Active  Participation Quality:  Appropriate      Behavioral Observation:  Well Groomed, Alert, and Appropriate.   Current Psychosocial Factors:  The patient reports that she has been coping a little bit better recently and has been actively working on therapeutic interventions particular round psychosocial stressors..   Content of Session:    review current symptoms and worked on therapeutic interventions are in issues of anxiety and depression and coping issues.   Current Status:   The patient reports some improvement in her overall coping skills and that she has been experiencing improvement in her symptoms of anxiety and depression..   Patient Progress:    stable   Target Goals:    target goals include  reducing the intensity, severity, and duration of anxiety symptoms as well as depressive symptoms.   Last Reviewed:    09/07/2013   Goals Addressed Today:    today we worked on Producer, television/film/videobuilding coping skills utilizing cognitive/behavioral therapeutic interventions for issues of depression and anxiety.   Impression/Diagnosis:    the patient has a long history of anxiety and depression as well as attentional problems. She has been taking both antianxiety medications, antidepressant medications as well as psychostimulant medications in the past.   Diagnosis:    Axis I: Generalized anxiety disorder  Depressive disorder, not elsewhere classified

## 2013-11-17 NOTE — Progress Notes (Signed)
Patient:  Debra Lewis   DOB: 09/04/1976  MR Number: [id number]  [facility] [address] [place] [id number] Dept: [phone]  Start: 11 AM End: 12 PM  Provider/Observer:     Hershal CoriaJohn R Angeliah Wisdom PSYD  Chief Complaint:      Chief Complaint  Patient presents with  . Depression  . Anxiety    Reason For Service:     The patient was referred because of ongoing issues of anxiety and current stressors and depression. The patient reports that she was in a 20 year relationship that recently ended in that she moved back to the area to move in with her parents. The patient reports that there are major stressors due to the fact that her father is a crack cocaine abuser and the patient is in constant state of trying to help him with little success. The patient reports that there is a lot of conflict between her and her ex and they have a son together and the patient reports that her son worries all the time about the situation the patient is in a situation she has to take care of him.   Interventions Strategy:   cognitive/behavioral psychotherapy   Participation Level:   Active  Participation Quality:  Appropriate      Behavioral Observation:  Well Groomed, Alert, and Appropriate.   Current Psychosocial Factors:  the patient reports that she is struggling after moving back to the area after the breakup of a 20 year relationship and coping with her son.   Content of Session:    review current symptoms and worked on therapeutic interventions are in issues of anxiety and depression and coping issues.   Current Status:   patient describes significant symptoms of anxiety, worry, attentional problems, and depression.   Patient Progress:    stable   Target Goals:    target goals include reducing the intensity, severity, and duration of anxiety symptoms as well as depressive symptoms.   Last  Reviewed:    07/28/2013   Goals Addressed Today:    today we worked on Producer, television/film/videobuilding coping skills utilizing cognitive/behavioral therapeutic interventions for issues of depression and anxiety.   Impression/Diagnosis:    the patient has a long history of anxiety and depression as well as attentional problems. She has been taking both antianxiety medications, antidepressant medications as well as psychostimulant medications in the past.   Diagnosis:    Axis I: Generalized anxiety disorder  Depressive disorder, not elsewhere classified

## 2013-11-23 ENCOUNTER — Ambulatory Visit: Payer: Medicaid Other | Admitting: Obstetrics & Gynecology

## 2013-12-06 ENCOUNTER — Ambulatory Visit: Payer: Medicaid Other | Admitting: Obstetrics & Gynecology

## 2013-12-17 ENCOUNTER — Ambulatory Visit (INDEPENDENT_AMBULATORY_CARE_PROVIDER_SITE_OTHER): Payer: Medicaid Other | Admitting: Obstetrics & Gynecology

## 2013-12-17 ENCOUNTER — Encounter: Payer: Self-pay | Admitting: Obstetrics & Gynecology

## 2013-12-17 VITALS — BP 80/60 | Wt 127.0 lb

## 2013-12-17 DIAGNOSIS — N3010 Interstitial cystitis (chronic) without hematuria: Secondary | ICD-10-CM

## 2013-12-17 NOTE — Progress Notes (Signed)
Patient ID: Debra Lewis, female   DOB: 02/12/1976, 38 y.o.   MRN: [id number] Patient ID: Debra Lewis, female   DOB: 05/27/1976, 38 y.o.   MRN: [id number] Patient ID: Debra Lewis, female   DOB: 11/26/1975, 38 y.o.   MRN: [id number] Patient ID: Debra Lewis, female   DOB: 08/02/1976, 38 y.o.   MRN: [id number] Patient ID: Debra Lewis, female   DOB: 09/06/1976, 38 y.o.   MRN: [id number] Debra Lewis is in for treatment for her chronic interstitial cystitis She had DMSO treatments 10 weeks ago Her urine today is clear  50 cc DMSO is instilledl there is minimal post what residual  We will see her back in 6 weeks for repeat DMSO

## 2014-01-07 ENCOUNTER — Other Ambulatory Visit: Payer: Self-pay | Admitting: Obstetrics & Gynecology

## 2014-01-21 ENCOUNTER — Encounter (HOSPITAL_COMMUNITY): Payer: Self-pay | Admitting: Psychology

## 2014-01-21 ENCOUNTER — Ambulatory Visit (HOSPITAL_COMMUNITY): Payer: Self-pay | Admitting: Psychology

## 2014-01-27 ENCOUNTER — Ambulatory Visit: Payer: Medicaid Other | Admitting: Obstetrics & Gynecology

## 2014-02-04 ENCOUNTER — Ambulatory Visit: Payer: Medicaid Other | Admitting: Obstetrics & Gynecology

## 2014-02-14 ENCOUNTER — Ambulatory Visit (INDEPENDENT_AMBULATORY_CARE_PROVIDER_SITE_OTHER): Payer: Medicaid Other | Admitting: Obstetrics & Gynecology

## 2014-02-14 ENCOUNTER — Encounter: Payer: Self-pay | Admitting: Obstetrics & Gynecology

## 2014-02-14 VITALS — BP 110/70 | Wt 126.0 lb

## 2014-02-14 DIAGNOSIS — N3010 Interstitial cystitis (chronic) without hematuria: Secondary | ICD-10-CM

## 2014-02-14 NOTE — Progress Notes (Signed)
Patient ID: Debra Lewis, female   DOB: 12/16/1975, 38 y.o.   MRN: [id number] Patient ID: Debra Lewis, female   DOB: 01/02/1976, 38 y.o.   MRN: [id number] Patient ID: Debra Lewis, female   DOB: 10/24/1976, 38 y.o.   MRN: [id number] Patient ID: Debra Lewis, female   DOB: 11/02/1975, 38 y.o.   MRN: [id number] Patient ID: Debra Lewis, female   DOB: 10/02/1976, 38 y.o.   MRN: [id number] Patient ID: Debra Lewis, female   DOB: 08/30/1976, 38 y.o.   MRN: [id number] Debra Lewis is in for treatment for her chronic interstitial cystitis She had DMSO treatments 10 weeks ago Her urine today is clear  50 cc DMSO is instilledl there is minimal post what residual  We will Lewis her back in 6 weeks for repeat DMSO  Past Medical History  Diagnosis Date  . Anxiety   . Asthma   . Migraines   . HSV-2 (herpes simplex virus 2) infection   . Abnormal pap   . History of frequent urinary tract infections   . Bacterial vaginosis   . Interstitial cystitis   . OCD (obsessive compulsive disorder)   . ADHD (attention deficit hyperactivity disorder)   . Pityriasis rosea     Past Surgical History  Procedure Laterality Date  . Conization of cervix      OB History   Grav Para Term Preterm Abortions TAB SAB Ect Mult Living   1 1 1       1       Allergies  Allergen Reactions  . Metrogel [Metronidazole]   . Flagyl [Metronidazole Hcl] Nausea And Vomiting, Swelling and Rash  . Sulfonamide Derivatives Swelling and Rash    History   Social History  . Marital Status: Single    Spouse Name: N/A    Number of Children: N/A  . Years of Education: N/A   Social History Main Topics  . Smoking status: Never Smoker   . Smokeless tobacco: Never Used  . Alcohol Use: Yes     Comment: socially  . Drug Use: No  . Sexual Activity: Not Currently   Other Topics Concern  . None   Social History Narrative  . None    Family History  Problem Relation Age of Onset  . Cancer    . Heart disease    .  Diabetes    . Lung disease    . Lung cancer Sister

## 2014-02-28 ENCOUNTER — Other Ambulatory Visit: Payer: Self-pay | Admitting: Obstetrics and Gynecology

## 2014-03-30 ENCOUNTER — Ambulatory Visit: Payer: Medicaid Other | Admitting: Obstetrics & Gynecology

## 2014-04-06 ENCOUNTER — Ambulatory Visit: Payer: Medicaid Other | Admitting: Obstetrics & Gynecology

## 2014-04-21 ENCOUNTER — Encounter: Payer: Self-pay | Admitting: Obstetrics & Gynecology

## 2014-04-21 ENCOUNTER — Ambulatory Visit (INDEPENDENT_AMBULATORY_CARE_PROVIDER_SITE_OTHER): Payer: Medicaid Other | Admitting: Obstetrics & Gynecology

## 2014-04-21 VITALS — BP 120/80 | Wt 130.0 lb

## 2014-04-21 DIAGNOSIS — N3010 Interstitial cystitis (chronic) without hematuria: Secondary | ICD-10-CM

## 2014-04-21 NOTE — Progress Notes (Signed)
   Subjective:    Patient ID: Debra Lewis, female    DOB: 04/14/1976, 38 y.o.   MRN: [id number]  HPI    Review of Systems     Objective:   Physical Exam Blood pressure 120/80, weight 130 lb (58.968 kg), last menstrual period 04/07/2014. Debra Lewis is in for treatment for her chronic interstitial cystitis She had DMSO treatments 10 weeks ago Her urine today is clear  50 cc DMSO is instilledl there is minimal post what residual  We will see her back in 6 weeks for repeat DMSO  Past Medical History  Diagnosis Date  . Anxiety   . Asthma   . Migraines   . HSV-2 (herpes simplex virus 2) infection   . Abnormal pap   . History of frequent urinary tract infections   . Bacterial vaginosis   . Interstitial cystitis   . OCD (obsessive compulsive disorder)   . ADHD (attention deficit hyperactivity disorder)   . Pityriasis rosea     Past Surgical History  Procedure Laterality Date  . Conization of cervix      OB History   Grav Para Term Preterm Abortions TAB SAB Ect Mult Living   1 1 1       1       Allergies  Allergen Reactions  . Metrogel [Metronidazole]   . Flagyl [Metronidazole Hcl] Nausea And Vomiting, Swelling and Rash  . Sulfonamide Derivatives Swelling and Rash    History   Social History  . Marital Status: Single    Spouse Name: N/A    Number of Children: N/A  . Years of Education: N/A   Social History Main Topics  . Smoking status: Never Smoker   . Smokeless tobacco: Never Used  . Alcohol Use: Yes     Comment: socially  . Drug Use: No  . Sexual Activity: Not Currently   Other Topics Concern  . None   Social History Narrative  . None    Family History  Problem Relation Age of Onset  . Cancer    . Heart disease    . Diabetes    . Lung disease    . Lung cancer Sister           Assessment & Plan:

## 2014-06-21 ENCOUNTER — Other Ambulatory Visit: Payer: Medicaid Other | Admitting: Obstetrics & Gynecology

## 2014-06-23 ENCOUNTER — Encounter: Payer: Self-pay | Admitting: Obstetrics & Gynecology

## 2014-06-23 ENCOUNTER — Ambulatory Visit (INDEPENDENT_AMBULATORY_CARE_PROVIDER_SITE_OTHER): Payer: Medicaid Other | Admitting: Obstetrics & Gynecology

## 2014-06-23 ENCOUNTER — Other Ambulatory Visit (HOSPITAL_COMMUNITY)
Admission: RE | Admit: 2014-06-23 | Discharge: 2014-06-23 | Disposition: A | Discharge status: Home / Self Care | Payer: Medicaid Other | Source: Ambulatory Visit | Attending: Obstetrics & Gynecology | Admitting: Obstetrics & Gynecology

## 2014-06-23 VITALS — BP 100/80 | Ht 59.0 in | Wt 132.0 lb

## 2014-06-23 DIAGNOSIS — Z01419 Encounter for gynecological examination (general) (routine) without abnormal findings: Secondary | ICD-10-CM | POA: Insufficient documentation

## 2014-06-23 DIAGNOSIS — Z1151 Encounter for screening for human papillomavirus (HPV): Secondary | ICD-10-CM | POA: Diagnosis present

## 2014-06-23 DIAGNOSIS — Z113 Encounter for screening for infections with a predominantly sexual mode of transmission: Secondary | ICD-10-CM | POA: Diagnosis present

## 2014-06-23 DIAGNOSIS — R87810 Cervical high risk human papillomavirus (HPV) DNA test positive: Secondary | ICD-10-CM | POA: Insufficient documentation

## 2014-06-23 DIAGNOSIS — Z0000 Encounter for general adult medical examination without abnormal findings: Secondary | ICD-10-CM

## 2014-06-23 DIAGNOSIS — Z7251 High risk heterosexual behavior: Secondary | ICD-10-CM

## 2014-06-23 NOTE — Progress Notes (Signed)
Patient ID: Debra Lewis, female   DOB: 1976-06-08, 38 y.o.   MRN: [id number] Subjective:     Debra Lewis is a 38 y.o. female here for a routine exam.  Patient's last menstrual period was 05/21/2014. G1P1001 Birth Control Method:  na Menstrual Calendar(currently): regular  Current complaints: none.   Current acute medical issues:  none   Recent Gynecologic History Patient's last menstrual period was 05/21/2014. Last Pap: 2014,  normal Last mammogram: ,    Past Medical History  Diagnosis Date  . Anxiety   . Asthma   . Migraines   . HSV-2 (herpes simplex virus 2) infection   . Abnormal pap   . History of frequent urinary tract infections   . Bacterial vaginosis   . Interstitial cystitis   . OCD (obsessive compulsive disorder)   . ADHD (attention deficit hyperactivity disorder)   . Pityriasis rosea     Past Surgical History  Procedure Laterality Date  . Conization of cervix      OB History   Grav Para Term Preterm Abortions TAB SAB Ect Mult Living   History   Social History  . Marital Status: Single    Spouse Name: N/A    Number of Children: N/A  . Years of Education: N/A   Social History Main Topics  . Smoking status: Never Smoker   . Smokeless tobacco: Never Used  . Alcohol Use: Yes     Comment: socially  . Drug Use: No  . Sexual Activity: Not Currently   Other Topics Concern  . None   Social History Narrative  . None    Family History  Problem Relation Age of Onset  . Cancer    . Heart disease    . Diabetes    . Lung disease    . Lung cancer Sister   . Breast cancer Maternal Aunt      Review of Systems  Review of Systems  Constitutional: Negative for fever, chills, weight loss, malaise/fatigue and diaphoresis.  HENT: Negative for hearing loss, ear pain, nosebleeds, congestion, sore throat, neck pain, tinnitus and ear discharge.   Eyes: Negative for blurred vision, double vision, photophobia, pain, discharge and  redness.  Respiratory: Negative for cough, hemoptysis, sputum production, shortness of breath, wheezing and stridor.   Cardiovascular: Negative for chest pain, palpitations, orthopnea, claudication, leg swelling and PND.  Gastrointestinal: negative for abdominal pain. Negative for heartburn, nausea, vomiting, diarrhea, constipation, blood in stool and melena.  Genitourinary: Negative for dysuria, urgency, frequency, hematuria and flank pain.  Musculoskeletal: Negative for myalgias, back pain, joint pain and falls.  Skin: Negative for itching and rash.  Neurological: Negative for dizziness, tingling, tremors, sensory change, speech change, focal weakness, seizures, loss of consciousness, weakness and headaches.  Endo/Heme/Allergies: Negative for environmental allergies and polydipsia. Does not bruise/bleed easily.  Psychiatric/Behavioral: Negative for depression, suicidal ideas, hallucinations, memory loss and substance abuse. The patient is not nervous/anxious and does not have insomnia.        Objective:    Physical Exam  Vitals reviewed. Constitutional: She is oriented to person, place, and time. She appears well-developed and well-nourished.  HENT:  Head: Normocephalic and atraumatic.        Right Ear: External ear normal.  Left Ear: External ear normal.  Nose: Nose normal.  Mouth/Throat: Oropharynx is clear and moist.  Eyes: Conjunctivae and EOM are normal. Pupils are equal, round,  and reactive to light. Right eye exhibits no discharge. Left eye exhibits no discharge. No scleral icterus.  Neck: Normal range of motion. Neck supple. No tracheal deviation present. No thyromegaly present.  Cardiovascular: Normal rate, regular rhythm, normal heart sounds and intact distal pulses.  Exam reveals no gallop and no friction rub.   No murmur heard. Respiratory: Effort normal and breath sounds normal. No respiratory distress. She has no wheezes. She has no rales. She exhibits no tenderness.  GI:  Soft. Bowel sounds are normal. She exhibits no distension and no mass. There is no tenderness. There is no rebound and no guarding.  Genitourinary:  Breasts no masses skin changes or nipple changes bilaterally      Vulva is normal without lesions Vagina is pink moist without discharge Cervix normal in appearance and pap is done Uterus is normal size shape and contour Adnexa is negative with normal sized ovaries   Musculoskeletal: Normal range of motion. She exhibits no edema and no tenderness.  Neurological: She is alert and oriented to person, place, and time. She has normal reflexes. She displays normal reflexes. No cranial nerve deficit. She exhibits normal muscle tone. Coordination normal.  Skin: Skin is warm and dry. No rash noted. No erythema. No pallor.  Psychiatric: She has a normal mood and affect. Her behavior is normal. Judgment and thought content normal.       Assessment:    Healthy female exam.    Plan:    Follow up in: 1 year.   for yearly DMSO due in 2-3 weeks

## 2014-06-23 NOTE — Addendum Note (Signed)
Addended by: Richardson Chiquito on: 06/23/2014 04:08 PM   Modules accepted: Orders

## 2014-06-24 LAB — HEPATITIS C ANTIBODY: HCV AB: NEGATIVE

## 2014-06-24 LAB — HSV 2 ANTIBODY, IGG: HSV 2 GLYCOPROTEIN G AB, IGG: 5.44 IV — AB

## 2014-06-24 LAB — HIV ANTIBODY (ROUTINE TESTING W REFLEX): HIV 1&2 Ab, 4th Generation: NONREACTIVE

## 2014-06-24 LAB — RPR

## 2014-06-28 ENCOUNTER — Telehealth: Payer: Self-pay | Admitting: Obstetrics & Gynecology

## 2014-06-28 LAB — CYTOLOGY - PAP

## 2014-06-29 NOTE — Telephone Encounter (Signed)
Pt informed of WNL pap from 06/23/2014, neg RPR, HIV, +HSV 2.

## 2014-07-14 ENCOUNTER — Ambulatory Visit: Payer: Medicaid Other | Admitting: Obstetrics & Gynecology

## 2014-07-19 ENCOUNTER — Ambulatory Visit (INDEPENDENT_AMBULATORY_CARE_PROVIDER_SITE_OTHER): Payer: Medicaid Other | Admitting: Obstetrics & Gynecology

## 2014-07-19 ENCOUNTER — Encounter: Payer: Self-pay | Admitting: Obstetrics & Gynecology

## 2014-07-19 VITALS — BP 128/80 | Wt 136.0 lb

## 2014-07-19 DIAGNOSIS — N898 Other specified noninflammatory disorders of vagina: Secondary | ICD-10-CM

## 2014-07-19 DIAGNOSIS — N3010 Interstitial cystitis (chronic) without hematuria: Secondary | ICD-10-CM

## 2014-07-19 MED ORDER — METRONIDAZOLE 500 MG PO TABS: 500.0000 mg | ORAL_TABLET | Freq: Two times a day (BID) | ORAL | Status: DC

## 2014-07-19 NOTE — Progress Notes (Signed)
Patient ID: Debra Lewis, female   DOB: 10/14/1976, 38 y.o.   MRN: [id number] Debra Lewis is in for treatment for her chronic interstitial cystitis  She had DMSO treatments 10 weeks ago  Her urine today is clear  50 cc DMSO is instilledl there is minimal post what residual  We will see her back in 6 weeks for repeat DMSO   Wet prep  WBC infiltrate no yeast bv or trich  Metronidazole 500 BID

## 2014-07-22 ENCOUNTER — Telehealth: Payer: Self-pay | Admitting: Obstetrics & Gynecology

## 2014-07-26 ENCOUNTER — Telehealth: Payer: Self-pay | Admitting: *Deleted

## 2014-07-26 NOTE — Telephone Encounter (Signed)
Pt states that she can not take the pill form of flagyl, and needed the metrogel. Pharmacist changed it for her and she started that yesterday. Pt sttes that everything is good.

## 2014-07-26 NOTE — Telephone Encounter (Signed)
Spoke with pt today and she stated that medication issue was taken care of and she started it last night.

## 2014-08-29 ENCOUNTER — Encounter: Payer: Self-pay | Admitting: Obstetrics & Gynecology

## 2014-08-30 ENCOUNTER — Ambulatory Visit: Payer: Medicaid Other | Admitting: Obstetrics & Gynecology

## 2014-09-06 ENCOUNTER — Ambulatory Visit: Payer: Medicaid Other | Admitting: Obstetrics & Gynecology

## 2014-09-08 ENCOUNTER — Ambulatory Visit: Payer: Medicaid Other | Admitting: Obstetrics & Gynecology

## 2014-09-09 ENCOUNTER — Ambulatory Visit (INDEPENDENT_AMBULATORY_CARE_PROVIDER_SITE_OTHER): Payer: Medicaid Other | Admitting: Obstetrics & Gynecology

## 2014-09-09 ENCOUNTER — Encounter: Payer: Self-pay | Admitting: Obstetrics & Gynecology

## 2014-09-09 VITALS — BP 110/70 | Wt 133.0 lb

## 2014-09-09 DIAGNOSIS — N3010 Interstitial cystitis (chronic) without hematuria: Secondary | ICD-10-CM

## 2014-09-09 NOTE — Progress Notes (Signed)
Patient ID: Debra Lewis, female   DOB: 12/06/1975, [age] y.o.   MRN: [id number] Diagnosed with IC: 2007  Current Meds: Elmiron, uribel prn   Dietary restrictions    Pt states her symptoms have been stable, no exacerbations, although not perfect Wants to continue on this cycle  Blood pressure 110/70, weight 133 lb (60.328 kg), last menstrual period 08/17/2014.    The external urethra meatus was prepped with betadine DMSO 50 cc was instilled in the usual fashion after the bladder was catheterized and emptied completely 50cc was instilled into the bladder without difficulty and the patient tolerated well She will refrain from voiding as long as possible  Follow up in 8 weeks, or as patient requests based on her symptom complex

## 2014-11-04 ENCOUNTER — Ambulatory Visit (INDEPENDENT_AMBULATORY_CARE_PROVIDER_SITE_OTHER): Payer: Medicaid Other | Admitting: Obstetrics & Gynecology

## 2014-11-04 ENCOUNTER — Encounter: Payer: Self-pay | Admitting: Obstetrics & Gynecology

## 2014-11-04 VITALS — BP 140/88 | Ht 59.0 in | Wt 139.0 lb

## 2014-11-04 DIAGNOSIS — E038 Other specified hypothyroidism: Secondary | ICD-10-CM

## 2014-11-04 DIAGNOSIS — Z1389 Encounter for screening for other disorder: Secondary | ICD-10-CM

## 2014-11-04 DIAGNOSIS — Z3202 Encounter for pregnancy test, result negative: Secondary | ICD-10-CM

## 2014-11-04 DIAGNOSIS — N3010 Interstitial cystitis (chronic) without hematuria: Secondary | ICD-10-CM

## 2014-11-04 DIAGNOSIS — M545 Low back pain: Secondary | ICD-10-CM

## 2014-11-04 LAB — POCT URINALYSIS DIPSTICK
Blood, UA: NEGATIVE
Glucose, UA: NEGATIVE
Ketones, UA: NEGATIVE
Leukocytes, UA: NEGATIVE
Nitrite, UA: NEGATIVE

## 2014-11-04 LAB — POCT URINE PREGNANCY: PREG TEST UR: NEGATIVE

## 2014-11-04 NOTE — Progress Notes (Signed)
Patient ID: Debra Lewis, female   DOB: 01/20/1976, 39 y.o.   MRN: [id number] Diagnosed with IC: several years ago  Current Meds:  Current outpatient prescriptions: albuterol (PROVENTIL HFA;VENTOLIN HFA) 108 (90 BASE) MCG/ACT inhaler, Inhale 2 puffs into the lungs every 6 (six) hours as needed for wheezing., Disp: , Rfl: ;  ALPRAZolam (XANAX) 1 MG tablet, Take 1 mg by mouth 3 (three) times daily as needed for sleep or anxiety. , Disp: , Rfl:  amphetamine-dextroamphetamine (ADDERALL) 20 MG tablet, Take by mouth 2 (two) times daily. Pt states 30 mg am and 20 mg pm, Disp: , Rfl: ;  clobetasol (TEMOVATE) 0.05 % external solution, Apply 1 application topically daily as needed., Disp: , Rfl: ;  clobetasol cream (TEMOVATE) 0.05 %, Apply topically as needed., Disp: , Rfl: ;  cycloSPORINE (RESTASIS) 0.05 % ophthalmic emulsion, Place 1 drop into both eyes daily., Disp: , Rfl:  fluconazole (DIFLUCAN) 150 MG tablet, USE AS DIRECTED., Disp: 2 tablet, Rfl: PRN;  fluticasone (FLONASE) 50 MCG/ACT nasal spray, Place 2 sprays into the nose daily., Disp: , Rfl: ;  hydrOXYzine (ATARAX/VISTARIL) 25 MG tablet, Take 25 mg by mouth as needed for itching., Disp: , Rfl: ;  ibuprofen (ADVIL,MOTRIN) 800 MG tablet, Take 800 mg by mouth daily as needed for pain (Associated with nerve damage)., Disp: , Rfl:  Methen-Bella-Meth Bl-Phen Sal (URISED PO), Take by mouth as needed., Disp: , Rfl: ;  pentosan polysulfate (ELMIRON) 100 MG capsule, Take 100 mg by mouth 2 (two) times daily. 2 cap po Bid, Disp: , Rfl: ;  PORTIA-28 0.15-30 MG-MCG tablet, TAKE (1) TABLET BY MOUTH ONCE DAILY AS DIRECTED., Disp: 28 tablet, Rfl: 11 promethazine (PHENERGAN) 25 MG tablet, Take 25 mg by mouth every 6 (six) hours as needed for nausea (Associated with taking pain medication)., Disp: , Rfl: ;  terconazole (TERAZOL 7) 0.4 % vaginal cream, INSERT 1 APPLICATORFUL PER VAGINA AT BEDTIME., Disp: 45 g, Rfl: PRN;  tiZANidine (ZANAFLEX) 2 MG tablet, Take 2 mg by mouth  every 6 (six) hours as needed (as muscle relaxer). , Disp: , Rfl:  venlafaxine (EFFEXOR) 75 MG tablet, Take 75 mg by mouth 2 (two) times daily., Disp: , Rfl: ;  zolmitriptan (ZOMIG) 5 MG nasal solution, Place 2.5 mg into the nose as needed for migraine (Take one-half tablet by mouth at onset of migraine. take other half tablet two hours later if needed for migraine relief)., Disp: , Rfl:  metroNIDAZOLE (FLAGYL) 500 MG tablet, Take 1 tablet (500 mg total) by mouth 2 (two) times daily. (Patient not taking: Reported on 11/04/2014), Disp: 14 tablet, Rfl: 0;  triamcinolone cream (KENALOG) 0.5 %, Apply topically as needed., Disp: , Rfl:  Dietary restrictions    Pt states her symptoms have been stable, no exacerbations, although not perfect Wants to continue on this cycle  Blood pressure 140/88, height 4\' 11"  (1.499 m), weight 139 lb (63.05 kg), last menstrual period 10/14/2014.    The external urethra meatus was prepped with betadine DMSO 50 cc was instilled in the usual fashion after the bladder was catheterized and emptied completely 50cc was instilled into the bladder without difficulty and the patient tolerated well She will refrain from voiding as long as possible  Follow up in 6 weeks, or as patient requests based on her symptom complex

## 2014-11-05 LAB — TSH: TSH: 1.266 u[IU]/mL (ref 0.350–4.500)

## 2014-11-07 ENCOUNTER — Telehealth: Payer: Self-pay | Admitting: *Deleted

## 2014-11-07 NOTE — Telephone Encounter (Signed)
Pt informed of normal thyroid results from 11/04/2014.

## 2014-11-07 NOTE — Telephone Encounter (Signed)
-----   Message from Lazaro ArmsLuther H Eure, MD sent at 11/07/2014  9:37 AM EST ----- Call pt and tell her that the TSH thyroid test was normal  Dr Despina HiddenEure ----- Message -----    From: Olen Pelhrystal Pulliam, RN    Sent: 11/04/2014  12:39 PM      To: Lazaro ArmsLuther H Eure, MD

## 2014-12-02 ENCOUNTER — Other Ambulatory Visit: Payer: Self-pay | Admitting: Obstetrics & Gynecology

## 2014-12-16 ENCOUNTER — Ambulatory Visit: Payer: Medicaid Other | Admitting: Obstetrics & Gynecology

## 2014-12-29 ENCOUNTER — Ambulatory Visit: Payer: Medicaid Other | Admitting: Obstetrics & Gynecology

## 2015-01-03 ENCOUNTER — Ambulatory Visit (INDEPENDENT_AMBULATORY_CARE_PROVIDER_SITE_OTHER): Payer: Medicaid Other | Admitting: Obstetrics & Gynecology

## 2015-01-03 ENCOUNTER — Encounter: Payer: Self-pay | Admitting: Obstetrics & Gynecology

## 2015-01-03 VITALS — BP 120/80 | HR 76 | Temp 98.2°F | Wt 139.4 lb

## 2015-01-03 DIAGNOSIS — N3010 Interstitial cystitis (chronic) without hematuria: Secondary | ICD-10-CM

## 2015-01-03 NOTE — Progress Notes (Signed)
Patient ID: Debra Lewis, female   DOB: 10/01/1976, 39 y.o.   MRN: [id number] Diagnosed with IC: years ago  Current Meds:  elmiron DMSO irrigations  Dietary restrictions  Pt states her symptoms have been stable, no exacerbations, although not perfect Wants to continue on this cycle  Blood pressure 120/80, pulse 76, temperature 98.2 F (36.8 C), weight 139 lb 6.4 oz (63.231 kg), last menstrual period 12/15/2014.    The external urethra meatus was prepped with betadine DMSO 50 cc was instilled in the usual fashion after the bladder was catheterized and emptied completely 50cc was instilled into the bladder without difficulty and the patient tolerated well She will refrain from voiding as long as possible  Follow up in 6 weeks, or as patient requests based on her symptom complex  Complaining of sore throat cough today Oropharynx is clear Lungs clear no wheezes

## 2015-01-20 ENCOUNTER — Other Ambulatory Visit: Payer: Self-pay | Admitting: Obstetrics & Gynecology

## 2015-01-24 ENCOUNTER — Encounter (HOSPITAL_COMMUNITY): Payer: Self-pay | Admitting: *Deleted

## 2015-01-24 ENCOUNTER — Ambulatory Visit (HOSPITAL_COMMUNITY): Payer: Self-pay | Admitting: Psychiatry

## 2015-02-02 ENCOUNTER — Other Ambulatory Visit: Payer: Self-pay | Admitting: Obstetrics and Gynecology

## 2015-02-14 ENCOUNTER — Ambulatory Visit: Payer: Medicaid Other | Admitting: Obstetrics & Gynecology

## 2015-02-20 ENCOUNTER — Ambulatory Visit: Payer: Medicaid Other | Admitting: Obstetrics & Gynecology

## 2015-02-23 ENCOUNTER — Ambulatory Visit (INDEPENDENT_AMBULATORY_CARE_PROVIDER_SITE_OTHER): Payer: MEDICAID | Admitting: Psychiatry

## 2015-02-23 ENCOUNTER — Encounter (HOSPITAL_COMMUNITY): Payer: Self-pay | Admitting: Psychiatry

## 2015-02-23 VITALS — BP 112/73 | HR 100 | Ht 59.0 in | Wt 149.8 lb

## 2015-02-23 DIAGNOSIS — F902 Attention-deficit hyperactivity disorder, combined type: Secondary | ICD-10-CM | POA: Diagnosis not present

## 2015-02-23 DIAGNOSIS — F331 Major depressive disorder, recurrent, moderate: Secondary | ICD-10-CM

## 2015-02-23 DIAGNOSIS — F42 Obsessive-compulsive disorder: Secondary | ICD-10-CM

## 2015-02-23 MED ORDER — LISDEXAMFETAMINE DIMESYLATE 70 MG PO CAPS: 70.0000 mg | ORAL_CAPSULE | Freq: Every day | ORAL | Status: DC

## 2015-02-23 MED ORDER — VENLAFAXINE HCL ER 75 MG PO CP24: 225.0000 mg | ORAL_CAPSULE | Freq: Every day | ORAL | Status: AC

## 2015-02-23 NOTE — Progress Notes (Signed)
Psychiatric Assessment Adult  Patient Identification:  Debra Lewis Date of Evaluation:  02/23/2015 Chief Complaint: I have depression, ADHD and OCD History of Chief Complaint:   Chief Complaint  Patient presents with  . Depression  . Anxiety  . ADHD  . Establish Care    HPI this patient is a 39 year old single black female who lives with her parents and her 64 year old son in [place]. She is currently not employed. The patient was referred by [facility] for further treatment and assessment of depression anxiety and OCD.  The patient is a rather disorganized historian but claims that she was diagnosed with ADHD after her son was born in 2005. She has always been distractible and focus and disorganized. She also has OCD and that she has to do things over and over to get them right and always second guesses everything. She also states that she's been depressed since her teenage years. When she was in middle school her father started abusing crack cocaine and selling off things in the house. At one time he left home and had checked [facility] and ended up in a homeless shelter. Obviously this was very upsetting to the patient and her family. As a teenager she is to come to this clinic for counseling.  In the past she's been on numerous antidepressants such as Lexapro Paxil and Prozac but none of them really did much for her or cause side effects. She's currently on Effexor and it is helped to some degree. However she stays depressed all the time and cries frequently. The father of her child was with her for 21 years and then broke up with her. She found out later that he had had 9 other children with various women. He is also a Higher education careers adviser and got put in [facility] last year in [place]. Somehow she feels like she is the one who failed this relationship. She blames herself for problems with him and worries constantly that she is a bad mother although she does on her child who has  asthma and is very sickly.  Currently the patient states she is depressed has passive suicidal ideation but has never acted on it, has low energy, is gaining weight although she claims she doesn't eat much. She's currently on Adderall which helps her focus to some degree but today she is very disorganized. She doesn't sleep well at night because her mind won't shut off and she's very anxious despite using Xanax. She does not use drugs or alcohol and does not have psychotic symptoms. She saw Dr. Shelva Majestic for counseling here a couple of times in 2014 but has not had  any counseling since Review of Systems  Constitutional: Positive for unexpected weight change.  Eyes: Negative.   Respiratory: Negative.   Cardiovascular: Negative.   Gastrointestinal: Negative.   Endocrine: Negative.   Genitourinary: Positive for difficulty urinating.  Musculoskeletal: Negative.   Allergic/Immunologic: Negative.   Neurological: Positive for headaches.  Psychiatric/Behavioral: Positive for suicidal ideas, sleep disturbance, dysphoric mood and decreased concentration. The patient is nervous/anxious.    Physical Exam not done  Depressive Symptoms: depressed mood, anhedonia, insomnia, psychomotor retardation, feelings of worthlessness/guilt, hopelessness, suicidal thoughts without plan, anxiety, panic attacks, loss of energy/fatigue, weight gain,  (Hypo) Manic Symptoms:   Elevated Mood:  No Irritable Mood:  No Grandiosity:  No Distractibility:  Yes Labiality of Mood:  Yes Delusions:  No Hallucinations:  No Impulsivity:  No Sexually Inappropriate Behavior:  No Financial Extravagance:  No Flight  of Ideas:  No  Anxiety Symptoms: Excessive Worry:  Yes Panic Symptoms:  yes Agoraphobia:  No Obsessive Compulsive: Yes  Symptoms: Has to do things over and over again until they're perfect Specific Phobias:  No Social Anxiety:  No  Psychotic Symptoms:  Hallucinations: No None Delusions:   No Paranoia:  No   Ideas of Reference:  No  PTSD Symptoms: Ever had a traumatic exposure:  No Had a traumatic exposure in the last month:  No Re-experiencing: No None Hypervigilance:  No Hyperarousal: No None Avoidance: No None  Traumatic Brain Injury: No   Past Psychiatric History: Diagnosis: Depression, anxiety, OCD   Hospitalizations: none  Outpatient Care: She has been treated in this clinic in the past   Substance Abuse Care: none  Self-Mutilation: none  Suicidal Attempts: none  Violent Behaviors: none   Past Medical History:   Past Medical History  Diagnosis Date  . Anxiety   . Asthma   . Migraines   . HSV-2 (herpes simplex virus 2) infection   . Abnormal pap   . History of frequent urinary tract infections   . Bacterial vaginosis   . Interstitial cystitis   . OCD (obsessive compulsive disorder)   . ADHD (attention deficit hyperactivity disorder)   . Pityriasis rosea   . Depression    History of Loss of Consciousness:  No Seizure History:  No Cardiac History:  No Allergies:   Allergies  Allergen Reactions  . Metrogel [Metronidazole]   . Flagyl [Metronidazole Hcl] Nausea And Vomiting, Swelling and Rash  . Sulfonamide Derivatives Swelling and Rash   Current Medications:  Current Outpatient Prescriptions  Medication Sig Dispense Refill  . albuterol (PROVENTIL HFA;VENTOLIN HFA) 108 (90 BASE) MCG/ACT inhaler Inhale 2 puffs into the lungs every 6 (six) hours as needed for wheezing.    Marland Kitchen ALPRAZolam (XANAX) 1 MG tablet Take 1 mg by mouth 3 (three) times daily as needed for sleep or anxiety.     . clobetasol cream (TEMOVATE) 0.05 % APPLY TO AFFECTED AREA AT BEDTIME. 60 g 11  . cycloSPORINE (RESTASIS) 0.05 % ophthalmic emulsion Place 1 drop into both eyes daily.    . fluconazole (DIFLUCAN) 150 MG tablet USE AS DIRECTED. 2 tablet PRN  . fluticasone (FLONASE) 50 MCG/ACT nasal spray Place 2 sprays into the nose daily.    . hydrOXYzine (ATARAX/VISTARIL) 25 MG tablet  Take 25 mg by mouth as needed for itching.    Marland Kitchen ibuprofen (ADVIL,MOTRIN) 800 MG tablet Take 800 mg by mouth daily as needed for pain (Associated with nerve damage).    Jonna Clark Bl-Phen Sal (URISED PO) Take by mouth as needed.    . pentosan polysulfate (ELMIRON) 100 MG capsule Take 100 mg by mouth 2 (two) times daily. EVERY 3 MONTHS GETS INJECTION    . PORTIA-28 0.15-30 MG-MCG tablet TAKE (1) TABLET BY MOUTH ONCE DAILY AS DIRECTED. 28 tablet 11  . promethazine (PHENERGAN) 25 MG tablet Take 25 mg by mouth every 6 (six) hours as needed for nausea (Associated with taking pain medication).    Marland Kitchen terconazole (TERAZOL 7) 0.4 % vaginal cream INSERT 1 APPLICATORFUL PER VAGINA AT BEDTIME. 45 g PRN  . tiZANidine (ZANAFLEX) 2 MG tablet Take 2 mg by mouth every 6 (six) hours as needed (as muscle relaxer).     Marland Kitchen lisdexamfetamine (VYVANSE) 70 MG capsule Take 1 capsule (70 mg total) by mouth daily. 30 capsule 0  . triamcinolone cream (KENALOG) 0.5 % Apply topically as needed.    Marland Kitchen  venlafaxine XR (EFFEXOR XR) 75 MG 24 hr capsule Take 3 capsules (225 mg total) by mouth daily with breakfast. 90 capsule 2   No current facility-administered medications for this visit.    Previous Psychotropic Medications:  Medication Dose   Prozac Paxil Wellbutrin Concerta                        Substance Abuse History in the last 12 months: Substance Age of 1st Use Last Use Amount Specific Type  Nicotine      Alcohol      Cannabis      Opiates      Cocaine      Methamphetamines      LSD      Ecstasy      Benzodiazepines      Caffeine      Inhalants      Others:                          Medical Consequences of Substance Abuse: none  Legal Consequences of Substance Abuse: none  Family Consequences of Substance Abuse: none  Blackouts:  No DT's:  No Withdrawal Symptoms:  No None  Social History: [place] Place of Birth: [place] Family Members: Parents and  son Marital Status:  Single Children:   Sons: 1  Daughters:  Relationships: Not dating Education:  [employer] Graduate Educational Problems/Performance:  Religious Beliefs/Practices: Christian History of Abuse: none Armed forces technical officerccupational Experiences; 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 Legal History: none Hobbies/Interests: none  Family History:   Family History  Problem Relation Age of Onset  . Cancer    . Heart disease    . Diabetes    . Lung disease    . Lung cancer Sister   . Breast cancer Maternal Aunt   . Depression Mother   . Drug abuse Father     Mental Status Examination/Evaluation: Objective:  Appearance: Casual, Neat and Well Groomed  Eye Contact::  Good  Speech:  Clear and Coherent  Volume:  Normal  Mood:  Depressed and anxious   Affect:  Constricted, Depressed and Tearful  Thought Process:  Circumstantial and Disorganized  Orientation:  Full (Time, Place, and Person)  Thought Content:  Obsessions and Rumination  Suicidal Thoughts:  Yes.  without intent/plan  Homicidal Thoughts:  No  Judgement:  Fair  Insight:  Lacking  Psychomotor Activity:  Normal  Akathisia:  No  Handed:  Right  AIMS (if indicated):    Assets:  Communication Skills Desire for Improvement Physical Health Resilience Social Support Talents/Skills    Laboratory/X-Ray Psychological Evaluation(s)   Recent TSH was normal      Assessment:  Axis I: ADHD, combined type, Major Depression, Recurrent severe and Obsessive Compulsive Disorder  AXIS I ADHD, combined type, Major Depression, Recurrent severe and Obsessive Compulsive Disorder  AXIS II Deferred  AXIS III Past Medical History  Diagnosis Date  . Anxiety   . Asthma   . Migraines   . HSV-2 (herpes simplex virus 2) infection   . Abnormal pap   . History of frequent urinary tract infections   . Bacterial vaginosis   . Interstitial cystitis   . OCD (obsessive compulsive disorder)   . ADHD (attention deficit hyperactivity disorder)    . Pityriasis rosea   . Depression      AXIS IV other psychosocial or environmental problems  AXIS V 51-60 moderate symptoms   Treatment  Plan/Recommendations:  Plan of Care: Medication management   Laboratory:    Psychotherapy: She'll be assigned to a counselor here   Medications: She'll discontinue Adderall because she still seems disorganized on it. She will start Vyvanse 70 mg every morning for ADHD symptoms. She'll change Effexor to Effexor XR 225 mg every morning for depression. She still has Xanax 1 mg and has been instructed to take it 3 times a day on a scheduled basis for anxiety   Routine PRN Medications:  No  Consultations:   Safety Concerns:  She denies thoughts of harm to self or others   Other: She'll return in 4 weeks     Diannia Ruder, MD 4/28/20163:23 PM

## 2015-02-27 ENCOUNTER — Encounter: Payer: Self-pay | Admitting: Obstetrics & Gynecology

## 2015-02-27 ENCOUNTER — Ambulatory Visit (INDEPENDENT_AMBULATORY_CARE_PROVIDER_SITE_OTHER): Payer: Medicaid Other | Admitting: Obstetrics & Gynecology

## 2015-02-27 VITALS — BP 132/84 | HR 76 | Wt 151.0 lb

## 2015-02-27 DIAGNOSIS — N3010 Interstitial cystitis (chronic) without hematuria: Secondary | ICD-10-CM

## 2015-02-27 DIAGNOSIS — K2100 Gastro-esophageal reflux disease with esophagitis, without bleeding: Secondary | ICD-10-CM

## 2015-02-27 DIAGNOSIS — K5901 Slow transit constipation: Secondary | ICD-10-CM

## 2015-02-27 MED ORDER — OMEPRAZOLE 20 MG PO CPDR: 20.0000 mg | DELAYED_RELEASE_CAPSULE | Freq: Every day | ORAL | Status: DC

## 2015-02-27 MED ORDER — POLYETHYLENE GLYCOL 3350 17 GM/SCOOP PO POWD: 1.0000 | Freq: Four times a day (QID) | ORAL | Status: DC | PRN

## 2015-02-27 NOTE — Progress Notes (Signed)
Patient ID: Debra Lewis, female   DOB: 02/28/1976, 39 y.o.   MRN: [id number] Diagnosed with IC: years ago  Current Meds:  elmiron Dietary restrictions    Pt states her symptoms have been stable, no exacerbations, although not perfect Wants to continue on this cycle  Blood pressure 132/84, pulse 76, weight 151 lb (68.493 kg), last menstrual period 02/01/2015.    The external urethra meatus was prepped with betadine DMSO 50 cc was instilled in the usual fashion after the bladder was catheterized and emptied completely 50cc was instilled into the bladder without difficulty and the patient tolerated well She will refrain from voiding as long as possible  Follow up in 6 weeks, or as patient requests based on her symptom complex   Also pt has not had a BM in 1 month never happened before  No meds changed  Recommend dulcolax suppository and then 1-4 scoops per day of miralax powder  Also will try omeperazole for tickle cough her lungs are clear

## 2015-03-06 ENCOUNTER — Telehealth: Payer: Self-pay | Admitting: Obstetrics & Gynecology

## 2015-03-06 NOTE — Telephone Encounter (Signed)
Pt states she still has not had a BM. Pt states she did try the Dulcolax Suppositories but they irritated her and caused "swelling" so she stopped them and is only taking the Miralax. Pt states she is pushing water. Is there any other meds Dr. Despina HiddenEure recommends for BM. Pt aware Dr. Despina HiddenEure not back in the office till tomorrow.

## 2015-03-07 NOTE — Telephone Encounter (Signed)
1 bottle of magensium citrate twice daily until BM begin regularly Keep taking the miralax as well 1 scoop 4 times daily

## 2015-03-07 NOTE — Telephone Encounter (Signed)
Pt informed of Dr. Despina HiddenEure recommendation for constipation and verbalized understanding.

## 2015-03-14 ENCOUNTER — Ambulatory Visit: Payer: Medicaid Other | Admitting: Obstetrics & Gynecology

## 2015-03-14 ENCOUNTER — Ambulatory Visit (INDEPENDENT_AMBULATORY_CARE_PROVIDER_SITE_OTHER): Payer: Medicaid Other | Admitting: Obstetrics & Gynecology

## 2015-03-14 ENCOUNTER — Encounter: Payer: Self-pay | Admitting: Obstetrics & Gynecology

## 2015-03-14 VITALS — BP 160/120 | HR 80 | Wt 149.4 lb

## 2015-03-14 DIAGNOSIS — K5901 Slow transit constipation: Secondary | ICD-10-CM | POA: Diagnosis not present

## 2015-03-14 MED ORDER — POLYETHYLENE GLYCOL 3350 17 GM/SCOOP PO POWD: 1.0000 | Freq: Four times a day (QID) | ORAL | Status: DC | PRN

## 2015-03-14 MED ORDER — LINACLOTIDE 290 MCG PO CAPS: 290.0000 ug | ORAL_CAPSULE | Freq: Every day | ORAL | Status: DC

## 2015-03-14 NOTE — Progress Notes (Signed)
Patient ID: Debra Lewis, female   DOB: 10/21/1976, 39 y.o.   MRN: [id number] Chief Complaint  Patient presents with  . Follow-up    no bowel movement.    Blood pressure 160/120, pulse 80, weight 149 lb 6.4 oz (67.767 kg), last menstrual period 03/03/2015.  Pt seen 2 weeks ago For DMSO but also had not had BM in 4 weeks which had never happened before Since then 4 scoops of miralax daily, magnesium citrate bottle, suppositories with no relief  Abdomen soft benign non distended Rectal nothing in the vault nothing to manually disimpact  Will order linzess 290 daily 4 scoops of miralax powder every 15 minutes for 4 hours Refer to dr Karilyn Cotaehman for evaluation      Face to face time:  15 minutes  Greater than 50% of the visit time was spent in counseling and coordination of care with the patient.  The summary and outline of the counseling and care coordination is summarized in the note above.   All questions were answered.

## 2015-03-16 ENCOUNTER — Telehealth (HOSPITAL_COMMUNITY): Payer: Self-pay | Admitting: *Deleted

## 2015-03-16 NOTE — Telephone Encounter (Signed)
Pt called stating that she have not started the new medication that Dr. Tenny Crawoss started her on since her last visit. Per pt she have not been having bowel movement for about a month now. Per pt she went to her obgyn and her on Miralax in a higher dose. Per pt she have been taking all her other medications. Per pt, she thinks she will start it sometimes after she have a bowel movement. Per pt she does not want to start anything yet to make sure she can keep track on what's going on. Pt number is [phone].

## 2015-03-16 NOTE — Telephone Encounter (Signed)
noted 

## 2015-03-23 ENCOUNTER — Ambulatory Visit (HOSPITAL_COMMUNITY): Payer: Self-pay | Admitting: Psychiatry

## 2015-03-24 ENCOUNTER — Ambulatory Visit (HOSPITAL_COMMUNITY): Payer: Self-pay | Admitting: Psychiatry

## 2015-03-24 ENCOUNTER — Telehealth (HOSPITAL_COMMUNITY): Payer: Self-pay | Admitting: *Deleted

## 2015-03-24 NOTE — Telephone Encounter (Signed)
Per pt she will be good on her Effexor and Adderall 20 mg and 30 mg but her Xanxax will probably be out but that's ok.

## 2015-03-24 NOTE — Telephone Encounter (Signed)
Pt called stating that she is do not now if she should come into office today for f/u visit due to her having strep throat. Pt was last seen 02-23-15. Per pt she have not started her Effexor 75 mg TID and Vyvanse due to her not wanting to start anything new because she had not used the restroom for a month. Per pt she will also run out of her Adderall as well. Informed pt that due to notes on her chart, she was instructed to stop taking her Adderall. Per pt she did not want to just stop taking her medication due to her not starting her Vyvanse and Effexor 75 TID. Per pt, she would like to see if Dr. Tenny Crawoss could refill her Adderall 20mg  and 30 mg, Xanax TID, Effexor 75 mg. Pt number is [phone].

## 2015-03-30 ENCOUNTER — Ambulatory Visit (INDEPENDENT_AMBULATORY_CARE_PROVIDER_SITE_OTHER): Payer: MEDICAID | Admitting: Psychiatry

## 2015-03-30 ENCOUNTER — Encounter (HOSPITAL_COMMUNITY): Payer: Self-pay | Admitting: Psychiatry

## 2015-03-30 VITALS — BP 99/66 | HR 82 | Ht 59.0 in | Wt 146.4 lb

## 2015-03-30 DIAGNOSIS — F411 Generalized anxiety disorder: Secondary | ICD-10-CM

## 2015-03-30 DIAGNOSIS — F332 Major depressive disorder, recurrent severe without psychotic features: Secondary | ICD-10-CM

## 2015-03-30 DIAGNOSIS — F331 Major depressive disorder, recurrent, moderate: Secondary | ICD-10-CM

## 2015-03-30 DIAGNOSIS — F902 Attention-deficit hyperactivity disorder, combined type: Secondary | ICD-10-CM

## 2015-03-30 DIAGNOSIS — F42 Obsessive-compulsive disorder: Secondary | ICD-10-CM

## 2015-03-30 MED ORDER — ALPRAZOLAM 1 MG PO TABS: 1.0000 mg | ORAL_TABLET | Freq: Three times a day (TID) | ORAL | Status: AC | PRN

## 2015-03-30 NOTE — Progress Notes (Signed)
Patient ID: Debra Lewis, female   DOB: 01/30/1976, 39 y.o.   MRN: [id number]  Psychiatric Assessment Adult  Patient Identification:  Debra Reamsraci M Flaugher Date of Evaluation:  03/30/2015 Chief Complaint: I have depression, ADHD and OCD History of Chief Complaint:   Chief Complaint  Patient presents with  . Depression  . ADHD  . Anxiety  . Follow-up    Anxiety Symptoms include decreased concentration, nervous/anxious behavior and suicidal ideas.     this patient is a 39 year old single black female who lives with her parents and her 41 year old son in [place]. She is currently not employed. The [facility] for [facility] treatment and assessment of depression anxiety and OCD.  The patient is a rather disorganized historian but claims that she was diagnosed with ADHD after her son was born in 2005. She has always been distractible and focus and disorganized. She also has OCD and that she has to do things over and over to get them right and always second guesses everything. She also states that she's been depressed since her teenage years. When she was in middle school her father started abusing crack cocaine and selling off things in the house. At one time he left home and had checked [facility] and ended up in a homeless shelter. Obviously this was very upsetting to the patient and her family. As a teenager she is to come to this clinic for counseling.  In the past she's been on numerous antidepressants such as Lexapro Paxil and Prozac but none of them really did much for her or cause side effects. She's currently on Effexor and it is helped to some degree. However she stays depressed all the time and cries frequently. The father of her child was with her for 21 years and then broke up with her. She found out later that he had had 9 other children with various women. He is also a Higher education careers adviserdrug dealer and got put in [facility] last year in [place]. Somehow she feels like  she is the one who failed this relationship. She blames herself for problems with him and worries constantly that she is a bad mother although she does on her child who has asthma and is very sickly.  Currently the patient states she is depressed has passive suicidal ideation but has never acted on it, has low energy, is gaining weight although she claims she doesn't eat much. She's currently on Adderall which helps her focus to some degree but today she is very disorganized. She doesn't sleep well at night because her mind won't shut off and she's very anxious despite using Xanax. She does not use drugs or alcohol and does not have psychotic symptoms. She saw Dr. Shelva Majesticodenbaugh for counseling here a couple of times in 2014 but has not had  any counseling since  The patient returns after 5 weeks. She states that she has not started any of the new medicines because she's had severe constipation and hasn't had a bowel movement for 3 weeks. Her OB/GYN physician gave her Leonides GrillsLenz S and told her to use Mira lax every 15 minutes for 4 hours. She has not done this yet. They've also sent a new referral to a GI specialist and she's not seen them yet either. I told her to stop by the GI specialist office today and try to get in because she will end up with a bowel obstruction and she doesn't have a bowel movement soon. She states that she  is still depressed and I encouraged her to start Effexor XR 225 mg daily. She is out of her Xanax and I have refilled this for her today. I also urged her to try the Vyvanse 70 mg daily rather than splitting up the Adderall away she is doing. The patient seems confused and uncertain about all of this but I urged her to call the office if there are further problems Review of Systems  Constitutional: Positive for unexpected weight change.  Eyes: Negative.   Respiratory: Negative.   Cardiovascular: Negative.   Gastrointestinal: Negative.   Endocrine: Negative.   Genitourinary: Positive for  difficulty urinating.  Musculoskeletal: Negative.   Allergic/Immunologic: Negative.   Neurological: Positive for headaches.  Psychiatric/Behavioral: Positive for suicidal ideas, sleep disturbance, dysphoric mood and decreased concentration. The patient is nervous/anxious.    Physical Exam not done  Depressive Symptoms: depressed mood, anhedonia, insomnia, psychomotor retardation, feelings of worthlessness/guilt, hopelessness, suicidal thoughts without plan, anxiety, panic attacks, loss of energy/fatigue, weight gain,  (Hypo) Manic Symptoms:   Elevated Mood:  No Irritable Mood:  No Grandiosity:  No Distractibility:  Yes Labiality of Mood:  Yes Delusions:  No Hallucinations:  No Impulsivity:  No Sexually Inappropriate Behavior:  No Financial Extravagance:  No Flight of Ideas:  No  Anxiety Symptoms: Excessive Worry:  Yes Panic Symptoms:  yes Agoraphobia:  No Obsessive Compulsive: Yes  Symptoms: Has to do things over and over again until they're perfect Specific Phobias:  No Social Anxiety:  No  Psychotic Symptoms:  Hallucinations: No None Delusions:  No Paranoia:  No   Ideas of Reference:  No  PTSD Symptoms: Ever had a traumatic exposure:  No Had a traumatic exposure in the last month:  No Re-experiencing: No None Hypervigilance:  No Hyperarousal: No None Avoidance: No None  Traumatic Brain Injury: No   Past Psychiatric History: Diagnosis: Depression, anxiety, OCD   Hospitalizations: none  Outpatient Care: She has been treated in this clinic in the past   Substance Abuse Care: none  Self-Mutilation: none  Suicidal Attempts: none  Violent Behaviors: none   Past Medical History:   Past Medical History  Diagnosis Date  . Anxiety   . Asthma   . Migraines   . HSV-2 (herpes simplex virus 2) infection   . Abnormal pap   . History of frequent urinary tract infections   . Bacterial vaginosis   . Interstitial cystitis   . OCD (obsessive compulsive  disorder)   . ADHD (attention deficit hyperactivity disorder)   . Pityriasis rosea   . Depression    History of Loss of Consciousness:  No Seizure History:  No Cardiac History:  No Allergies:   Allergies  Allergen Reactions  . Metrogel [Metronidazole]   . Flagyl [Metronidazole Hcl] Nausea And Vomiting, Swelling and Rash  . Sulfonamide Derivatives Swelling and Rash   Current Medications:  Current Outpatient Prescriptions  Medication Sig Dispense Refill  . albuterol (PROVENTIL HFA;VENTOLIN HFA) 108 (90 BASE) MCG/ACT inhaler Inhale 2 puffs into the lungs every 6 (six) hours as needed for wheezing.    Marland Kitchen ALPRAZolam (XANAX) 1 MG tablet Take 1 tablet (1 mg total) by mouth 3 (three) times daily as needed for sleep or anxiety. 90 tablet 2  . clobetasol cream (TEMOVATE) 0.05 % APPLY TO AFFECTED AREA AT BEDTIME. 60 g 11  . cycloSPORINE (RESTASIS) 0.05 % ophthalmic emulsion Place 1 drop into both eyes daily.    . fluconazole (DIFLUCAN) 150 MG tablet USE AS DIRECTED. 2 tablet  PRN  . fluticasone (FLONASE) 50 MCG/ACT nasal spray Place 2 sprays into the nose daily.    . hydrOXYzine (ATARAX/VISTARIL) 25 MG tablet Take 25 mg by mouth as needed for itching.    Marland Kitchen ibuprofen (ADVIL,MOTRIN) 800 MG tablet Take 800 mg by mouth daily as needed for pain (Associated with nerve damage).    . Linaclotide (LINZESS) 290 MCG CAPS capsule Take 1 capsule (290 mcg total) by mouth daily. 30 capsule 11  . Methen-Bella-Meth Bl-Phen Sal (URISED PO) Take by mouth as needed.    Marland Kitchen omeprazole (PRILOSEC) 20 MG capsule Take 1 capsule (20 mg total) by mouth daily. 1 tablet a day 30 capsule 6  . pentosan polysulfate (ELMIRON) 100 MG capsule Take 100 mg by mouth 2 (two) times daily. EVERY 3 MONTHS GETS INJECTION    . polyethylene glycol powder (GLYCOLAX/MIRALAX) powder Take 255 g by mouth 4 (four) times daily as needed. Pt will start at 1 scoop and work her way up to 4 per day if needed 255 g 11  . PORTIA-28 0.15-30 MG-MCG tablet  TAKE (1) TABLET BY MOUTH ONCE DAILY AS DIRECTED. 28 tablet 11  . promethazine (PHENERGAN) 25 MG tablet Take 25 mg by mouth every 6 (six) hours as needed for nausea (Associated with taking pain medication).    Marland Kitchen terconazole (TERAZOL 7) 0.4 % vaginal cream INSERT 1 APPLICATORFUL PER VAGINA AT BEDTIME. (Patient taking differently: INSERT 1 APPLICATORFUL PER VAGINA AT BEDTIME PRN) 45 g PRN  . tiZANidine (ZANAFLEX) 2 MG tablet Take 2 mg by mouth every 6 (six) hours as needed (as muscle relaxer).     Marland Kitchen lisdexamfetamine (VYVANSE) 70 MG capsule Take 1 capsule (70 mg total) by mouth daily. (Patient not taking: Reported on 03/30/2015) 30 capsule 0  . triamcinolone cream (KENALOG) 0.5 % Apply topically as needed.    . venlafaxine XR (EFFEXOR XR) 75 MG 24 hr capsule Take 3 capsules (225 mg total) by mouth daily with breakfast. (Patient not taking: Reported on 03/30/2015) 90 capsule 2   No current facility-administered medications for this visit.    Previous Psychotropic Medications:  Medication Dose   Prozac Paxil Wellbutrin Concerta                        Substance Abuse History in the last 12 months: Substance Age of 1st Use Last Use Amount Specific Type  Nicotine      Alcohol      Cannabis      Opiates      Cocaine      Methamphetamines      LSD      Ecstasy      Benzodiazepines      Caffeine      Inhalants      Others:                          Medical Consequences of Substance Abuse: none  Legal Consequences of Substance Abuse: none  Family Consequences of Substance Abuse: none  Blackouts:  No DT's:  No Withdrawal Symptoms:  No None  Social History: Current Place of Residence: [place] of Birth: [place] Family Members: Parents and son Marital Status:  Single Children:   Sons: 1  Daughters:  Relationships: Not dating Education:  [employer] Graduate Educational Problems/Performance:  Religious Beliefs/Practices: Christian History of Abuse: none Designer, industrial/product; 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 Legal History: none Hobbies/Interests: none  Family History:  Family History  Problem Relation Age of Onset  . Cancer    . Heart disease    . Diabetes    . Lung disease    . Lung cancer Sister   . Breast cancer Maternal Aunt   . Depression Mother   . Drug abuse Father     Mental Status Examination/Evaluation: Objective:  Appearance: Casual, Neat and Well Groomed  Eye Contact::  Good  Speech:  Clear and Coherent  Volume:  Normal  Mood:  Somewhat dysphoric   Affect:  Constricted   Thought Process:  Circumstantial and Disorganized  Orientation:  Full (Time, Place, and Person)  Thought Content:  Obsessions and Rumination  Suicidal Thoughts:  no  Homicidal Thoughts:  No  Judgement:  Fair  Insight:  Lacking  Psychomotor Activity:  Normal  Akathisia:  No  Handed:  Right  AIMS (if indicated):    Assets:  Communication Skills Desire for Improvement Physical Health Resilience Social Support Talents/Skills    Laboratory/X-Ray Psychological Evaluation(s)   Recent TSH was normal      Assessment:  Axis I: ADHD, combined type, Major Depression, Recurrent severe and Obsessive Compulsive Disorder  AXIS I ADHD, combined type, Major Depression, Recurrent severe and Obsessive Compulsive Disorder  AXIS II Deferred  AXIS III Past Medical History  Diagnosis Date  . Anxiety   . Asthma   . Migraines   . HSV-2 (herpes simplex virus 2) infection   . Abnormal pap   . History of frequent urinary tract infections   . Bacterial vaginosis   . Interstitial cystitis   . OCD (obsessive compulsive disorder)   . ADHD (attention deficit hyperactivity disorder)   . Pityriasis rosea   . Depression      AXIS IV other psychosocial or environmental problems  AXIS V 51-60 moderate symptoms   Treatment Plan/Recommendations:  Plan of Care: Medication management   Laboratory:    Psychotherapy: She'll be assigned to a counselor here    Medications: She'll discontinue Adderall because she still seems disorganized on it. She will start Vyvanse 70 mg every morning for ADHD symptoms. She'll change Effexor to Effexor XR 225 mg every morning for depression. She still has Xanax 1 mg and has been instructed to take it 3 times a day on a scheduled basis for anxiety. None of these instructions have changed but I have urged her to get into a GI specialist as soon as possible to deal with her severe constipation   Routine PRN Medications:  No  Consultations:   Safety Concerns:  She denies thoughts of harm to self or others   Other: She'll return in 4 weeks     Diannia Ruder, MD 6/2/20169:01 AM

## 2015-03-31 ENCOUNTER — Telehealth (HOSPITAL_COMMUNITY): Payer: Self-pay

## 2015-03-31 ENCOUNTER — Telehealth (HOSPITAL_COMMUNITY): Payer: Self-pay | Admitting: *Deleted

## 2015-03-31 NOTE — Telephone Encounter (Signed)
Pt called stating that her Effexor ER 3 tablet in Am and Vyvanse 70 mg QD is not working. Per pt, her first day taking these medication was yesterday 03-30-15. Per pt she took these medications at 10 am yesterday. Per pt, she feels like she's sick and don't now if it is the bowel issues she have going on. Per pt she have been up all night.Per pt she is not focusing.  Per pt she have been having anxiety attacks. Asked pt if she called 911 and she stated no because she knows how to calm down. Per pt, her thoughts are just everywhere and have this extra energy. Pt number is [phone]. Per pt, she's not going to take these medications anymore. Per pt this is an emergency to her due to the way she is feeling. Informed pt to call for emergency and offered to call emergency dispatch for her. Pt denied calling for help or office calling one for her and did not want to go to the hospital. Asked pt if there was anyone with her and she stated her mother was. Per pt she just need regular dose of Adderall and want to stop the Effexor and Vyvanse. Per pt these medications are not working. Offered to call emergency for pt again due to her keep saying how she felt. Informed pt that unfortunately Dr. Tenny Crawoss was out of the office today 03-31-15 and Monday 04-03-15. Informed pt that I was going to forward this message to the person that is taking messages for Dr. Tenny Crawoss right now. Per pt, this is an emergency and going to the hospital is not going to change her medication and if the person that the message is going to be forwarded to going to change her medication. Informed pt that all office can do right now is to send the message she and them can discuss that. Per pt, she don't know why there's not another provider in the office that can take this message right now instead of waiting until Tuesday. Informed pt again that office was not going to have her wait until Tuesday and this message is going to be sent to the person that is doing Dr.  Tenny Crawoss messages. Also informed pt that this message was going to be sent as an urgent message. Pt then started stating how the first two medications started making her feel. Was on the phone for a good 30 to 35 minutes explaining to pt that message with be sent and pt stating how she felt and what medication she wants to go back on and how she don't want to start new medication due to her bowel issues. Per pt she was not coming there to change her medication. Pt kept repeating all of the information that she had stated and had to keep telling pt that if this is an emergency office will dispatch emergency to her and pt denied again.

## 2015-03-31 NOTE — Telephone Encounter (Signed)
Met with Darlyne Russian, PA-C to review patient's record and discussed patient's change in medications with Dr. Harrington Challenger on 03/30/15 and reported side effects to changes.  Darlyne Russian, PA-V reviewed notes and we called [facility] to question patient's history with medications.  Pharmacist there reported patient filled Adderall 56m and 215mon 01/07/15, 11/30/14 adn 10/27/14 and Vyvanse on 02/24/15 that she stated just starting 03/30/15.  Patient last filled Effexor 7596mID on 12/02/14 and then increase and change to Effexor EX 225m80mily on 02/23/15 which she also stated taking for the first time on 03/30/15.  CharDarlyne Russian-C reported patient could stop Effexor XR as appears she has not been taking regular Effexor daily since last filled 12/02/14 and that he would provide her a one week supply order for Adderall 30mg35m 20mg 2my for 7 days if patient could pick up in [place] discontinue her Vyvanse with plan for patient to return these to show not taking.  Called patient and provided options CharleDarlyne Russian discussed as patient stated "just forget about it".  Patient stated she had already called her primary care MD office and was requesting they see her to assist.  Patient stated they are open on Saturday morning and may go in to be seen.  Patient stated she was in no shape to [place] [facility] of the offer by PA.  Patient stated she was still [facility] with [facility] as reports she feels she is not being heard and still does not understand why her medications were changed.  Patient stated plan to find another provider to see for her behavioral health issues.  Patient denied she was in any danger to her self or others, no suicidal or homicidal ideations but admitted to just being frustrated with how things have transpired with her medications and again does not think her needs are being met or that she is being heard pertaining to her medication.  Requested patient  follow up with Dr. Ross uHarrington Challengerher return if patient would like to discuss her medications further.  Patient again denied need for CharleDarlyne Russian to assist with one week Adderall orders and stated she would follow up with [facility] if did not go somewhere else for services.  Informed would send correspondence information to Dr. Ross aHarrington Challengeratient to call back as needed. Patient thanked nurse for follow up assistance.

## 2015-03-31 NOTE — Telephone Encounter (Signed)
Pt later called asking when the person that is suppose to call her is going to call her. Informed pt that office do not have a definite time that she will be contacted. Per pt she did not want Dr. Tenny Crawoss to change her medication and not she feels like she have to wait to Tuesday for her to get her Adderall. Informed to pt again that she will receive a call back and she do not have to wait for Tuesday. Pt then stated that when she came into Dr. Tenny Crawoss room at her f/u visit, Dr. Tenny Crawoss had asked her what her diagnose was and she is the one that's suppose to know. Per pt, she feels like she have to suffer with this until someone can call her back. Informed pt that office was going to send out 911 to go to her house to take her to the emergency room due to her keep saying that this is an emergency. Per then stated that she is not suicidal and she don't know why I would keep offering this to her. Informed pt that with the office policy, if someone keeps stating that this is an emergency then we have to office that emergency personnel be dispatched to her place of residence. Per pt, she's not sucidal and she just want her Adderall. Per pt she she don't take all medications well and she knows her body. Per pt she don't know if Dr. Tenny Crawoss got her medical records when she came back to practice because it should have stated that she can not take a lot of medications. Per pt, she was referred to this office for therapy for counseling and office had told her that if she had seen Dr. Kieth Brightlyodenbough in the past and don't want to see him again it was a process for her to switch to another provider. Informed pt that she was [facility] and cancelled appt and that was suppose to her first time. Pt then stated that she told Dr. Tenny Crawoss she did not want to change her medication and she did. Per pt she was not suppose to just take a pt off a medication with out tapering her off her Adderall which is a controlled subtane and she might lose  her license over that. Per pt she don't even know why she's seeing Dr. Tenny Crawoss she was suppose to see a therapist. She thought she was suppose to see Dr. Tenny Crawoss first and then get a therapist. Informed pt that she did not have to do that and Dr. Tenny Crawoss did have her sch an appt to see a therapist. While on the phone, pt kept repeating everything all over and kept saying that this is an emergency to her and she do not want to go to the hospital. Informed pt that she will get a call back. Per pt she just don't understand why she has to suffer and she don't want to put any new medication into her body because of her bowel issues. Informed pt that office do not want her to suffer and someone will call her and office offered to again to send 911 personnel and pt still denied help. Was on the phone with pt for 10-20 minutes. Then informed pt that I had to go because office had to close for lunch and pt became upset and said bye and hung up.

## 2015-03-31 NOTE — Telephone Encounter (Signed)
Information being sent to Dr. Lucianne MussKumar.

## 2015-03-31 NOTE — Telephone Encounter (Signed)
Telephone call with patient as she began to explain she started Effexor XR 225mg  on 03/30/15 and Vyvanse 70mg  the same date after seeing Dr. Tenny Crawoss around 10:00am.  Patient stated she had been up all night, was feeling jittery with nausea, increased heart rate and anxiousness.  Patient stated she had been having ongoing bowel issues over the past month as Dr. Tenny Crawoss was requesting she follow up with a GI provider as soon as possible.  Patient stated she is currently working on this and is awaiting Dr. Larrie Kassoman's office to schedule.  Patient stated she was taking Adderall 30mg  each morning and Adderall 20mg  each evening prior to seeing Dr. Tenny Crawoss on 03/30/15 along with a morning dosage of regular Effexor 75mg  BID.  Patient stated Dr. Tenny Crawoss did not explain to her why she was changing any of her medications but wanted those back the way they were as she stated she could not go until Tuesday 04/04/15 when Dr. Tenny Crawoss returns they way they are now.  Informed patient some of her symptoms may be related to GI symptoms as patient states she has not really had a movement in over a month.  Informed patient she needed to follow up on this ASAP as she could have a bowel obstruction causing nausea and other symptoms.  Informed patient this nurse would discuss with our PA at the [facility] office her concerns about medications but that it was unlikely her Vyvanse order would be changed today.  Agreed to call patient back after discussion and agreed to make sure Dr. Tenny Crawoss knows she is willing to begin therapy in the future with [facility] as was arranged but not while still having physical GI problems.

## 2015-04-04 NOTE — Telephone Encounter (Signed)
Please ask her if she intends to come back here. If so, i can reprint her Adderall

## 2015-04-10 NOTE — Telephone Encounter (Signed)
lmtcb

## 2015-04-11 ENCOUNTER — Ambulatory Visit: Payer: Medicaid Other | Admitting: Obstetrics & Gynecology

## 2015-04-11 ENCOUNTER — Telehealth: Payer: Self-pay | Admitting: *Deleted

## 2015-04-11 DIAGNOSIS — K5901 Slow transit constipation: Secondary | ICD-10-CM

## 2015-04-12 NOTE — Telephone Encounter (Signed)
Left messages x 3 with no response back from pt.

## 2015-04-13 NOTE — Telephone Encounter (Signed)
Called pt and lmtcb due to previous phone call and to inform her on what Dr. Tenny Craw comment was. Number provided

## 2015-04-18 ENCOUNTER — Ambulatory Visit: Payer: Medicaid Other | Admitting: Obstetrics & Gynecology

## 2015-04-21 ENCOUNTER — Ambulatory Visit: Payer: Medicaid Other | Admitting: Obstetrics & Gynecology

## 2015-04-25 ENCOUNTER — Ambulatory Visit (HOSPITAL_COMMUNITY): Payer: Self-pay | Admitting: Psychiatry

## 2015-04-25 ENCOUNTER — Other Ambulatory Visit: Payer: Self-pay | Admitting: Obstetrics & Gynecology

## 2015-05-02 ENCOUNTER — Ambulatory Visit (INDEPENDENT_AMBULATORY_CARE_PROVIDER_SITE_OTHER): Payer: Medicaid Other | Admitting: Obstetrics & Gynecology

## 2015-05-02 ENCOUNTER — Encounter: Payer: Self-pay | Admitting: Obstetrics & Gynecology

## 2015-05-02 ENCOUNTER — Other Ambulatory Visit: Payer: Self-pay | Admitting: *Deleted

## 2015-05-02 VITALS — BP 110/70 | HR 72 | Wt 144.4 lb

## 2015-05-02 DIAGNOSIS — N3010 Interstitial cystitis (chronic) without hematuria: Secondary | ICD-10-CM

## 2015-05-02 DIAGNOSIS — K5901 Slow transit constipation: Secondary | ICD-10-CM

## 2015-05-02 DIAGNOSIS — K5909 Other constipation: Secondary | ICD-10-CM

## 2015-05-02 NOTE — Progress Notes (Signed)
Patient ID: Debra Lewis, female   DOB: 01/19/1976, 39 y.o.   MRN: [id number] Diagnosed with IC: years ago  Current Meds:  elmiron Dietary restrictions    Pt states her symptoms have been stable, no exacerbations, although not perfect Wants to continue on this cycle  Blood pressure 110/70, pulse 72, weight 144 lb 6.4 oz (65.499 kg), last menstrual period 04/28/2015.    The external urethra meatus was prepped with betadine DMSO 50 cc was instilled in the usual fashion after the bladder was catheterized and emptied completely 50cc was instilled into the bladder without difficulty and the patient tolerated well She will refrain from voiding as long as possible  Follow up in 6 weeks, or as patient requests based on her symptom complex

## 2015-05-31 ENCOUNTER — Encounter (INDEPENDENT_AMBULATORY_CARE_PROVIDER_SITE_OTHER): Payer: Self-pay | Admitting: *Deleted

## 2015-06-13 ENCOUNTER — Ambulatory Visit: Payer: Medicaid Other | Admitting: Obstetrics & Gynecology

## 2015-06-22 ENCOUNTER — Ambulatory Visit (INDEPENDENT_AMBULATORY_CARE_PROVIDER_SITE_OTHER): Payer: Self-pay | Admitting: Internal Medicine

## 2015-06-22 ENCOUNTER — Ambulatory Visit: Payer: Medicaid Other | Admitting: Obstetrics & Gynecology

## 2015-06-30 ENCOUNTER — Ambulatory Visit: Payer: Medicaid Other | Admitting: Obstetrics & Gynecology

## 2015-07-02 ENCOUNTER — Emergency Department (HOSPITAL_COMMUNITY): Payer: Medicaid Other

## 2015-07-02 ENCOUNTER — Encounter (HOSPITAL_COMMUNITY): Payer: Self-pay | Admitting: Cardiology

## 2015-07-02 ENCOUNTER — Emergency Department (HOSPITAL_COMMUNITY)
Admission: EM | Admit: 2015-07-02 | Discharge: 2015-07-02 | Disposition: A | Discharge status: Home / Self Care | Payer: Medicaid Other | Attending: Emergency Medicine | Admitting: Emergency Medicine

## 2015-07-02 DIAGNOSIS — S80211A Abrasion, right knee, initial encounter: Secondary | ICD-10-CM | POA: Diagnosis not present

## 2015-07-02 DIAGNOSIS — Z7951 Long term (current) use of inhaled steroids: Secondary | ICD-10-CM | POA: Diagnosis not present

## 2015-07-02 DIAGNOSIS — F909 Attention-deficit hyperactivity disorder, unspecified type: Secondary | ICD-10-CM | POA: Diagnosis not present

## 2015-07-02 DIAGNOSIS — Z872 Personal history of diseases of the skin and subcutaneous tissue: Secondary | ICD-10-CM | POA: Diagnosis not present

## 2015-07-02 DIAGNOSIS — Z8619 Personal history of other infectious and parasitic diseases: Secondary | ICD-10-CM | POA: Insufficient documentation

## 2015-07-02 DIAGNOSIS — Z87440 Personal history of urinary (tract) infections: Secondary | ICD-10-CM | POA: Diagnosis not present

## 2015-07-02 DIAGNOSIS — Y998 Other external cause status: Secondary | ICD-10-CM | POA: Diagnosis not present

## 2015-07-02 DIAGNOSIS — Z79899 Other long term (current) drug therapy: Secondary | ICD-10-CM | POA: Diagnosis not present

## 2015-07-02 DIAGNOSIS — Z8679 Personal history of other diseases of the circulatory system: Secondary | ICD-10-CM | POA: Diagnosis not present

## 2015-07-02 DIAGNOSIS — J45909 Unspecified asthma, uncomplicated: Secondary | ICD-10-CM | POA: Diagnosis not present

## 2015-07-02 DIAGNOSIS — Z87448 Personal history of other diseases of urinary system: Secondary | ICD-10-CM | POA: Diagnosis not present

## 2015-07-02 DIAGNOSIS — Z8742 Personal history of other diseases of the female genital tract: Secondary | ICD-10-CM | POA: Insufficient documentation

## 2015-07-02 DIAGNOSIS — F419 Anxiety disorder, unspecified: Secondary | ICD-10-CM | POA: Diagnosis not present

## 2015-07-02 DIAGNOSIS — F329 Major depressive disorder, single episode, unspecified: Secondary | ICD-10-CM | POA: Diagnosis not present

## 2015-07-02 DIAGNOSIS — K5900 Constipation, unspecified: Secondary | ICD-10-CM | POA: Insufficient documentation

## 2015-07-02 DIAGNOSIS — W010XXA Fall on same level from slipping, tripping and stumbling without subsequent striking against object, initial encounter: Secondary | ICD-10-CM | POA: Diagnosis not present

## 2015-07-02 DIAGNOSIS — Y9289 Other specified places as the place of occurrence of the external cause: Secondary | ICD-10-CM | POA: Diagnosis not present

## 2015-07-02 DIAGNOSIS — S99921A Unspecified injury of right foot, initial encounter: Secondary | ICD-10-CM | POA: Diagnosis present

## 2015-07-02 DIAGNOSIS — Y9389 Activity, other specified: Secondary | ICD-10-CM | POA: Insufficient documentation

## 2015-07-02 DIAGNOSIS — S9031XA Contusion of right foot, initial encounter: Secondary | ICD-10-CM | POA: Diagnosis not present

## 2015-07-02 HISTORY — DX: Other constipation: K59.09

## 2015-07-02 MED ORDER — HYDROCODONE-ACETAMINOPHEN 5-325 MG PO TABS: ORAL_TABLET | ORAL | Status: DC

## 2015-07-02 MED ORDER — NAPROXEN 250 MG PO TABS
250.0000 mg | ORAL_TABLET | Freq: Two times a day (BID) | ORAL | Status: DC | PRN
Start: 2015-07-02 — End: 2015-10-09
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
## 2015-07-02 NOTE — ED Provider Notes (Signed)
CSN: [id number]     Arrival date & time 07/02/15  1811 History   First MD Initiated Contact with Patient 07/02/15 1821     Chief Complaint  Patient presents with  . Foot Injury      HPI Pt was seen at 1820. Per pt, c/o sudden onset and persistence of constant right foot "pain" that began last night approximately 2200. Pt states she "tripped over the edge of a loose carpet" and "twisted it somehow." Pain is to the top of her foot. Pt has been ambulatory since the injury. Pt states she also "got a carpet burn to my knee." Denies knee pain. Denies focal motor weakness, no tingling/numbness in extremities, no neck or back pain, no abd pain, no head injury.    Td UTD per pt Past Medical History  Diagnosis Date  . Anxiety   . Asthma   . Migraines   . HSV-2 (herpes simplex virus 2) infection   . Abnormal pap   . History of frequent urinary tract infections   . Bacterial vaginosis   . Interstitial cystitis   . OCD (obsessive compulsive disorder)   . ADHD (attention deficit hyperactivity disorder)   . Pityriasis rosea   . Depression   . Chronic constipation    Past Surgical History  Procedure Laterality Date  . Conization of cervix     Family History  Problem Relation Age of Onset  . Cancer    . Heart disease    . Diabetes    . Lung disease    . Lung cancer Sister   . Breast cancer Maternal Aunt   . Depression Mother   . Drug abuse Father    Social History  Substance Use Topics  . Smoking status: Never Smoker   . Smokeless tobacco: Never Used  . Alcohol Use: Yes     Comment: socially   OB History    Gravida Para Term Preterm AB TAB SAB Ectopic Multiple Living   1 1 1       1      Review of Systems ROS: Statement: All systems negative except as marked or noted in the HPI; Constitutional: Negative for fever and chills. ; ; Eyes: Negative for eye pain, redness and discharge. ; ; ENMT: Negative for ear pain, hoarseness, nasal congestion, sinus pressure and sore throat. ; ;  Cardiovascular: Negative for chest pain, palpitations, diaphoresis, dyspnea and peripheral edema. ; ; Respiratory: Negative for cough, wheezing and stridor. ; ; Gastrointestinal: Negative for nausea, vomiting, diarrhea, abdominal pain, blood in stool, hematemesis, jaundice and rectal bleeding. ; ; Genitourinary: Negative for dysuria, flank pain and hematuria. ; ; Musculoskeletal: +right foot/ankle pain. Negative for back pain and neck pain. Negative for deformity..; ; Skin: +bruising, abrasions. Negative for pruritus, rash, blisters, and skin lesion.; ; Neuro: Negative for headache, lightheadedness and neck stiffness. Negative for weakness, altered level of consciousness , altered mental status, extremity weakness, paresthesias, involuntary movement, seizure and syncope.      Allergies  Metrogel; Flagyl; and Sulfonamide derivatives  Home Medications   Prior to Admission medications   Medication Sig Start Date End Date Taking? Authorizing Provider  ALPRAZolam Prudy Feeler) 1 MG tablet Take 1 tablet (1 mg total) by mouth 3 (three) times daily as needed for sleep or anxiety. 03/30/15  Yes Myrlene Broker, MD  amphetamine-dextroamphetamine (ADDERALL) 30 MG tablet Take 30 mg by mouth 2 (two) times daily.   Yes Historical Provider, MD  clobetasol cream (TEMOVATE) 0.05 % APPLY TO  AFFECTED AREA AT BEDTIME. 12/05/14  Yes Lazaro Arms, MD  cycloSPORINE (RESTASIS) 0.05 % ophthalmic emulsion Place 1 drop into both eyes daily.   Yes Historical Provider, MD  fluconazole (DIFLUCAN) 150 MG tablet Take 150 mg by mouth as directed. TAKES AS  DIRECTED FOLLOWING MENSTRUAL CYCLE   Yes Historical Provider, MD  fluticasone (FLONASE) 50 MCG/ACT nasal spray Place 2 sprays into the nose daily.   Yes Historical Provider, MD  hydrOXYzine (ATARAX/VISTARIL) 25 MG tablet Take 25 mg by mouth daily as needed for itching.    Yes Historical Provider, MD  ibuprofen (ADVIL,MOTRIN) 800 MG tablet Take 800 mg by mouth daily as needed for pain  (Associated with nerve damage).   Yes Historical Provider, MD  Methen-Bella-Meth Bl-Phen Sal (URISED PO) Take 1 tablet by mouth daily as needed (for bladder pain relief).    Yes Historical Provider, MD  omeprazole (PRILOSEC) 20 MG capsule Take 1 capsule (20 mg total) by mouth daily. 1 tablet a day 02/27/15  Yes Lazaro Arms, MD  pentosan polysulfate (ELMIRON) 100 MG capsule Take 100 mg by mouth 2 (two) times daily. EVERY 3 MONTHS GETS INJECTION   Yes Historical Provider, MD  ZOXWRU-04 0.15-30 MG-MCG tablet TAKE (1) TABLET BY MOUTH ONCE DAILY AS DIRECTED. 02/02/15  Yes Tilda Burrow, MD  terconazole (TERAZOL 7) 0.4 % vaginal cream INSERT ONE APPLICATORFUL PER VAGINA AT BEDTIME. Patient taking differently: INSERT ONE APPLICATORFUL PER VAGINA AT BEDTIME AS NEEDED 04/26/15  Yes Lazaro Arms, MD  triamcinolone cream (KENALOG) 0.5 % Apply 1 application topically daily as needed (FOR IRRITATION).    Yes Historical Provider, MD  venlafaxine XR (EFFEXOR XR) 75 MG 24 hr capsule Take 3 capsules (225 mg total) by mouth daily with breakfast. Patient taking differently: Take 75 mg by mouth 2 (two) times daily.  02/23/15 02/23/16 Yes Myrlene Broker, MD  albuterol (PROVENTIL HFA;VENTOLIN HFA) 108 (90 BASE) MCG/ACT inhaler Inhale 2 puffs into the lungs every 6 (six) hours as needed for wheezing.    Historical Provider, MD  Linaclotide Karlene Einstein) 290 MCG CAPS capsule Take 1 capsule (290 mcg total) by mouth daily. Patient not taking: Reported on 05/02/2015 03/14/15   Lazaro Arms, MD  lisdexamfetamine (VYVANSE) 70 MG capsule Take 1 capsule (70 mg total) by mouth daily. Patient not taking: Reported on 03/30/2015 02/23/15   Myrlene Broker, MD  polyethylene glycol powder (GLYCOLAX/MIRALAX) powder Take 255 g by mouth 4 (four) times daily as needed. Pt will start at 1 scoop and work her way up to 4 per day if needed Patient not taking: Reported on 05/02/2015 03/14/15   Lazaro Arms, MD   BP 91/62 mmHg  Pulse 81  Temp(Src) 97.7 F  (36.5 C) (Oral)  Resp 16  Ht  (1.499 m)  Wt 149 lb (67.586 kg)  BMI 30.08 kg/m2  SpO2 96%  LMP 06/14/2015 Physical Exam  1825: Physical examination:  Nursing notes reviewed; Vital signs and O2 SAT reviewed;  Constitutional: Well developed, Well nourished, Well hydrated, In no acute distress; Head:  Normocephalic, atraumatic; Eyes: EOMI, PERRL, No scleral icterus; ENMT: Mouth and pharynx normal, Mucous membranes moist; Neck: Supple, Full range of motion;; Cardiovascular: Regular rate and rhythm.;; Respiratory: Breath sounds clear.  Speaking full sentences with ease, Normal respiratory effort/excursion; Chest: Nontender, Movement normal; Abdomen: Nondistended; Extremities: Pulses normal, No deformity. No calf edema or asymmetry. NT right hip/knee/toes. +tender to palp right dorsal foot area w/localized edema, NMS intact right foot,  strong pedal pp, LE muscle compartments soft.  No right proximal fibular head tenderness, no knee tenderness, no specific ankle tenderness.  No deformity, no ecchymosis, no open wounds.  +plantarflexion of right foot w/calf squeeze.  No palpable gap right Achilles's tendon.; Neuro: AA&Ox3, Major CN grossly intact.  Speech clear. No gross focal motor deficits in extremities.; Skin: Color normal, Warm, Dry.   ED Course  Procedures (including critical care time)  Labs Review   Imaging Review  I have personally reviewed and evaluated these images and lab results as part of my medical decision-making.   EKG Interpretation None      MDM  MDM Reviewed: previous chart, nursing note and vitals Interpretation: x-ray      Dg Ankle Complete Right 07/02/2015   CLINICAL DATA:  39 year old female who fell last night. Foot and ankle pain. Initial encounter. Reports prior ankle fracture in 1994.  EXAM: RIGHT ANKLE - COMPLETE 3+ VIEW  COMPARISON:  Right foot series from today reported separately.  FINDINGS: Generalized soft tissue swelling about the ankle, sparing the  posterior soft tissues. No definite joint effusion. Mortise joint alignment preserved. Talar dome intact. Calcaneus intact. No acute fracture identified. Small chronic ossific fragment adjacent to the medial malleolus was better demonstrated on the foot films today.  IMPRESSION: Soft tissue swelling. No acute fracture or dislocation identified about the right ankle.   Electronically Signed   By: Odessa Fleming M.D.   On: 07/02/2015 19:34   Dg Foot Complete Right 07/02/2015   CLINICAL DATA:  39 year old female with fall last night on carpet. Pain. Reports remote ankle fracture in 1994. Initial encounter.  EXAM: RIGHT FOOT COMPLETE - 3+ VIEW  COMPARISON:  Right ankle series from today reported separately.  FINDINGS: Moderate to severe soft tissue swelling about the mid and distal right foot. No subcutaneous gas. Calcaneus intact. Bone mineralization is within normal limits. Tarsal bones and metatarsals appear intact. Alignment within normal limits. Small chronic appearing ossific fragment at the medial malleolus. No acute osseous abnormality identified.  IMPRESSION: Soft tissue swelling. No acute fracture or dislocation identified about the right foot.   Electronically Signed   By: Odessa Fleming M.D.   On: 07/02/2015 19:26    1940:  XR reassuring. Tx symptomatically at this time. Dx and testing d/w pt.  Questions answered.  Verb understanding, agreeable to d/c home with outpt f/u.   Samuel Jester, DO 07/05/15 1309

## 2015-07-02 NOTE — ED Notes (Signed)
Patient verbalizes understanding of discharge instructions, prescription medications, home care and follow up care. Patient also demonstrates proper use of crutches. Patient out of department with crutches at this time.

## 2015-07-02 NOTE — Discharge Instructions (Signed)
°Emergency Department Resource Guide °1) Find a Doctor and Pay Out of Pocket °Although you won't have to find out who is covered by your insurance plan, it is a good idea to ask around and get recommendations. You will then need to call the office and see if the doctor you have chosen will accept you as a new patient and what types of options they offer for patients who are self-pay. Some doctors offer discounts or will set up payment plans for their patients who do not have insurance, but you will need to ask so you aren't surprised when you get to your appointment. ° °2) Contact Your Local Health Department °Not all health departments have doctors that can see patients for sick visits, but many do, so it is worth a call to see if yours does. If you don't know where your local health department is, you can check in your phone book. The CDC also has a tool to help you locate your state's health department, and many state websites also have listings of all of their local health departments. ° °3) Find a Walk-in Clinic °If your illness is not likely to be very severe or complicated, you may want to try a walk in clinic. These are popping up all over the country in pharmacies, drugstores, and shopping centers. They're usually staffed by nurse practitioners or physician assistants that have been trained to treat common illnesses and complaints. They're usually fairly quick and inexpensive. However, if you have serious medical issues or chronic medical problems, these are probably not your best option. ° °No Primary Care Doctor: °- Call [facility] at  832-8000 - they can help you locate a primary care doctor that  accepts your insurance, provides certain services, etc. °- Physician Referral Service- 1-800-533-3463 ° °Chronic Pain Problems: °Organization         Address  Phone   Notes  °[facility]  (336) 297-2271 Patients need to be referred by their primary care doctor.  ° °Medication  Assistance: °Organization         Address  Phone   Notes  °[place] [facility] [address] °[place], [place] [address] (336) 641-8030 --Must be a resident of [place] °-- Must have NO insurance coverage whatsoever (no Medicaid/ Medicare, etc.) °-- The pt. MUST have a primary care doctor that directs their care regularly and follows them in the community °  °[facility]  (866) 331-1348   °[employer]  (888) 892-1162   ° °Agencies that provide inexpensive medical care: °Organization         Address  Phone   Notes  °[facility]  (336) 832-8035   °[facility]    (336) 832-7272   °[facility] [address] °[place], [place] [address] (336) 832-4777   °[facility] [address], °[place] (336) 271-4999   °[facility]    (336) 373-0678   °[facility]    (336) 272-1050   °[facility] ° [address], [place] Phone:  (336) 832-4444, Fax:  (336) 832-4440 Hours of Operation:  9 am - 6 pm, M-F.  Also accepts Medicaid/Medicare and self-pay.  °[facility] ° [address], [place] Phone: (336) 832-3150, Fax: (336) [phone]. Hours of Operation:  8:30 am - 5:30 pm, M-F.  Also accepts Medicaid and self-pay.  °[facility] [place] [address], [place] Phone: (336) 878-6027   °[facility] [address], [place], [place] (336)723-1848, Ext. 123 Mondays & Thursdays: 7-9 AM.  First 15 patients are seen on a first come, first serve basis. °  ° °Medicaid-accepting [place] Providers: ° °Organization         Address  Phone   Notes  °[facility] [address], [place] (336) 641-2100 Also accepts self-pay patients.  °[facility] [address], [place] ° (336) 856-9996   °[facility] [address], [place]  (336) 288-8857   °[facility] [address] [place] [address], [place] (336) 299-7000   °Veita Bland [address], [place]  ° (336) 373-1557 Only accepts [place] Access Medicaid patients after they have their name applied to their card.  ° °Self-Pay (no insurance) in [place]: ° °Organization         Address  Phone   Notes  °Sickle Cell Patients, [facility] [address], [place] (336) 832-1970   °[facility] [address], [place] (336) 832-4400   °[facility] ° [address], [place] (336) 992-4800   °[facility]/Dr. Osei-Bonsu ° [address] [place] [address], [place] or [address], [place] (336) [phone] Phone number for both [place] and [place] locations is the same.  °[facility] [address], [place] (336) 299-0000   °[facility] [address] [place] [address], [place] or [address] (336) 852-7530 °(336) 878-2260   °[facility] [address], [place] (336) 350-1642, phone; (336) 294-5005, fax Sees patients 1st and 3rd Saturday of every month.  Must not qualify for public or private insurance (i.e. Medicaid, Medicare, [place] Health Choice, Veterans' Benefits) • Household income should be no more than 200% of the poverty level •The clinic cannot treat you if you are pregnant or think you are pregnant • Sexually transmitted diseases are not treated at the clinic.  ° ° °Dental Care: °Organization         Address  Phone  Notes  °[place] [facility] [address], [place] (336) 641-6152 Accepts children up to age 21 who are enrolled in Medicaid or [place] Health Choice; pregnant women with a Medicaid card; and children who have applied for Medicaid or [place] Health Choice, but were declined, whose parents can pay a reduced fee at time of service.  °[place]  [facility] [place]  [address], [place] (336) 641-7733 Accepts children up to age 21 who are enrolled in Medicaid or [place] Health Choice; pregnant women with a Medicaid card; and children who have applied for Medicaid or [place] Health Choice, but were declined, whose parents can pay a reduced fee at time of service.  °[facility] ° [address], [place] (336) 641-4533 Patients are seen by appointment only. Walk-ins are not accepted. [facility] will see patients 18 years of age and older. °Monday - Tuesday (8am-5pm) °Most Wednesdays (8:30-5pm) °$30 per visit, cash only  °[facility] ° [address], [place] (336) 641-4533 Patients are seen by appointment only. Walk-ins are not accepted. [facility] will see patients 18 years of age and older. °One   Wednesday Evening (Monthly: Volunteer Based).  $30 per visit, cash only  °[facility]  (919) 537-3737 for adults; Children under age 4, call Graduate Pediatric Dentistry at (919) [phone]. Children aged 4-14, please call (919) [phone] to request a pediatric application. ° Dental services are provided in all areas of dental care including fillings, crowns and bridges, complete and partial dentures, implants, gum treatment, root canals, and extractions. Preventive care is also provided. Treatment is provided to both adults and children. °Patients are selected via a lottery and there is often a waiting list. °  °[facility] [address], °[place] ° (336) 763-8833 [email or web address] °  °[facility] [address], [place], [place] (336)723-1848, Ext. 123 Second and Fourth Thursday of each month, opens at 6:30 AM; Clinic ends at 9 AM.  Patients are seen on a first-come first-served basis, and a limited number are seen during each clinic.  ° °[facility] ° [address], [place], [place] (336) 723-7904    Eligibility Requirements °You must have lived in [place], [place], or [place] counties for at least the last three months. °  You cannot be eligible for state or federal sponsored healthcare insurance, including Veterans Administration, Medicaid, or Medicare. °  You generally cannot be eligible for healthcare insurance through your employer.  °  How to apply: °Eligibility screenings are held every Tuesday and Wednesday afternoon from 1:00 pm until 4:00 pm. You do not need an appointment for the interview!  °[facility] [address], [place], [place] 336-631-2330   °[facility]  336-342-8273   °[facility]  336-703-3100   °[facility]  336-570-6415   ° °Behavioral Health Resources in the Community: °Intensive Outpatient Programs °Organization         Address  Phone  Notes  °[facility] [address], [place], [place] 336-878-6098   °[facility] [address], [place], [place] 336-832-9800   °[facility] [address], [place], [place] ° 336-882-2125   °[facility] [address],  °[place], [place] 1-800-853-5163 or 336-641-4981   °Substance Abuse Resources °Organization         Address  Phone  Notes  °[facility]  336-882-2125   °[facility]  336-784-9470   °[facility]  336-285-9073   °[facility]  336-845-3988   °Residential & Outpatient Substance Abuse Program  1-800-659-3381   °Psychological Services °Organization         Address  Phone  Notes  °[facility]  336- 832-9600   °[facility]  336- 378-7881   °[facility] [address], [place] 1-800-853-5163 or 336-641-4981   ° °Mobile Crisis Teams °Organization         Address  Phone  Notes  °[facility], Mobile Crisis Care Unit  1-877-626-1772   °Assertive °[facility] ° [address]  [place], [place] 336-834-9664   °Sharon DeEsch [address] °[place] 336-554-5454   ° °Self-Help/Support Groups °Organization         Address  Phone             Notes  °[facility]  - variety of support groups  336- 373-1402 Call for more information  °Narcotics Anonymous (NA), [facility] [address], °[place]  2 meetings at this location  ° °  Residential Treatment Programs Organization         Address  Phone  Notes  [facility] [address] [place] [address],    [place]  1-610-960-4540   [facility]  [address] [place] [address], [place], [place] 478-295-6213   [facility] [address] [place], [place] 086-578-4696 Admissions: 8am-3pm M-F  [facility] [address]    [place], [place] 295-284-1324   [facility] [address] [place], [place], [place] 401-027-2536   [facility] [address],  [place], [place] 644-034-7425   [facility] - Intensive Outpatient [address], [place], [place] 956-387-5643   [facility] ([facility] [address]  [place], [place] 3-295-188-4166 or 385-420-5222   [facility] ([facility] [address] [place], [place] 323-557-3220 Accepts Medicaid  [facility] [address]  [place] 2-542-706-2376 Substance Abuse/Addiction Treatment   [facility]         Address  Phone  Notes  [facility]  850-245-7913   Angie Fava, PhD [address] [place], [place]   260-094-6848 or 9040014992   [facility]   [address] [place], [place] 820-659-9121   [facility] [address] [place], [place] 778-399-6105 Insurance/Medicaid/sponsorship through [facility] and Families [address]                                    [place], [place] (469)828-7061 Therapy/tele-psych/case    [facility] [address], [place] 641-165-3708    Dr. Lolly Mustache  (209)498-5283   [facility]  [employer] [facility] Dept. 1) [address] [place] 2) [address] [place] 3)  [address], [place] 650-457-3212 780 794 2537  208-382-4279   [facility] Child Abuse Hotline (629)294-4692 or 406-742-1145 (After Hours)      Take the prescriptions as directed. Use your crutches for comfort for the next few days, then slowly return to your usual activities.  Apply moist heat or ice to the area(s) of discomfort, for 15 minutes at a time, several times per day for the next few days.  Do not fall asleep on a heating or ice pack. Wash the area with soap and water at least twice a day, and cover with a clean/dry dressing.  Change the dressing whenever it becomes wet or soiled after washing the area with soap and water.  Call your regular medical doctor on Monday to schedule a follow up appointment this week.  Return to the Emergency Department immediately if worsening.

## 2015-07-07 ENCOUNTER — Ambulatory Visit: Payer: Medicaid Other | Admitting: Obstetrics & Gynecology

## 2015-07-13 ENCOUNTER — Ambulatory Visit: Payer: Medicaid Other | Admitting: Obstetrics & Gynecology

## 2015-07-20 ENCOUNTER — Encounter: Payer: Self-pay | Admitting: Obstetrics & Gynecology

## 2015-07-20 ENCOUNTER — Ambulatory Visit (INDEPENDENT_AMBULATORY_CARE_PROVIDER_SITE_OTHER): Payer: Medicaid Other | Admitting: Obstetrics & Gynecology

## 2015-07-20 VITALS — BP 102/60 | HR 84 | Wt 154.0 lb

## 2015-07-20 DIAGNOSIS — N3010 Interstitial cystitis (chronic) without hematuria: Secondary | ICD-10-CM

## 2015-07-20 NOTE — Progress Notes (Signed)
Patient ID: Debra Lewis, female   DOB: 1976/05/31, 39 y.o.   MRN: [id number] Diagnosed with IC: several years ago  Current Meds:  DMSO Dietary restrictions    Pt states her symptoms have been stable, no exacerbations, although not perfect Wants to continue on this cycle  Blood pressure 102/60, pulse 84, weight 154 lb (69.854 kg), last menstrual period 06/14/2015.    The external urethra meatus was prepped with betadine DMSO 50 cc was instilled in the usual fashion after the bladder was catheterized and emptied completely 50cc was instilled into the bladder without difficulty and the patient tolerated well She will refrain from voiding as long as possible  Follow up in 8 weeks, or as patient requests based on her symptom complex

## 2015-09-14 ENCOUNTER — Ambulatory Visit: Payer: Medicaid Other | Admitting: Obstetrics & Gynecology

## 2015-09-19 ENCOUNTER — Ambulatory Visit: Payer: Medicaid Other | Admitting: Obstetrics & Gynecology

## 2015-09-26 ENCOUNTER — Ambulatory Visit: Payer: Medicaid Other | Admitting: Obstetrics & Gynecology

## 2015-09-27 ENCOUNTER — Ambulatory Visit: Payer: Medicaid Other | Admitting: Obstetrics & Gynecology

## 2015-10-02 ENCOUNTER — Ambulatory Visit: Payer: Medicaid Other | Admitting: Obstetrics & Gynecology

## 2015-10-04 ENCOUNTER — Telehealth: Payer: Self-pay | Admitting: *Deleted

## 2015-10-04 NOTE — Telephone Encounter (Signed)
Pt needs Rx for DMSO sent to [facility].

## 2015-10-04 NOTE — Telephone Encounter (Signed)
Is [facility] still compounding and for medicaid?

## 2015-10-05 ENCOUNTER — Telehealth: Payer: Self-pay | Admitting: Obstetrics & Gynecology

## 2015-10-05 MED ORDER — DIMETHYL SULFOXIDE 50 % IS SOLN: 50.0000 mL | Freq: Once | INTRAVESICAL | Status: DC

## 2015-10-06 ENCOUNTER — Ambulatory Visit: Payer: Medicaid Other | Admitting: Obstetrics & Gynecology

## 2015-10-09 ENCOUNTER — Ambulatory Visit (INDEPENDENT_AMBULATORY_CARE_PROVIDER_SITE_OTHER): Payer: Medicaid Other | Admitting: Obstetrics & Gynecology

## 2015-10-09 VITALS — BP 110/80 | HR 76 | Wt 160.0 lb

## 2015-10-09 DIAGNOSIS — N3010 Interstitial cystitis (chronic) without hematuria: Secondary | ICD-10-CM | POA: Diagnosis not present

## 2015-10-09 NOTE — Progress Notes (Signed)
Patient ID: Debra Lewis, female   DOB: 02/21/1976, 39 y.o.   MRN: [id number] Diagnosed with IC: several years ago  Current Meds:  DMSO Dietary restrictions    Pt states her symptoms have been stable, no exacerbations, although not perfect Wants to continue on this cycle  Blood pressure 110/80, pulse 76, weight 160 lb (72.576 kg).    The external urethra meatus was prepped with betadine DMSO 50 cc was instilled in the usual fashion after the bladder was catheterized and emptied completely 50cc was instilled into the bladder without difficulty and the patient tolerated well She will refrain from voiding as long as possible  Follow up in 12 weeks, or as patient requests based on her symptom complex

## 2016-01-10 ENCOUNTER — Ambulatory Visit: Payer: Medicaid Other | Admitting: Obstetrics & Gynecology

## 2016-01-11 ENCOUNTER — Ambulatory Visit: Payer: Medicaid Other | Admitting: Obstetrics & Gynecology

## 2016-01-16 ENCOUNTER — Ambulatory Visit: Payer: Medicaid Other | Admitting: Obstetrics & Gynecology

## 2016-01-17 ENCOUNTER — Encounter: Payer: Self-pay | Admitting: Obstetrics & Gynecology

## 2016-01-17 ENCOUNTER — Ambulatory Visit (INDEPENDENT_AMBULATORY_CARE_PROVIDER_SITE_OTHER): Payer: Medicaid Other | Admitting: Obstetrics & Gynecology

## 2016-01-17 VITALS — BP 100/70 | HR 80 | Ht 59.0 in | Wt 155.0 lb

## 2016-01-17 DIAGNOSIS — N3010 Interstitial cystitis (chronic) without hematuria: Secondary | ICD-10-CM

## 2016-01-17 NOTE — Progress Notes (Signed)
Patient ID: Debra Lewis M Oak, female   DOB: 07/29/1976, 40 y.o.   MRN: [id number] Diagnosed with IC: yers ago  Current Meds:  [place], DMSO Dietary restrictions    Pt states her symptoms have been stable, no exacerbations, although not perfect Wants to continue on this cycle  Blood pressure 100/70, pulse 80, height 4\' 11"  (1.499 m), weight 155 lb (70.308 kg).    The external urethra meatus was prepped with betadine DMSO 50 cc was instilled in the usual fashion after the bladder was catheterized and emptied completely 50cc was instilled into the bladder without difficulty and the patient tolerated well She will refrain from voiding as long as possible  Follow up in 8 weeks, or as patient requests based on her symptom complex

## 2016-01-22 ENCOUNTER — Encounter: Payer: Medicaid Other | Admitting: Obstetrics and Gynecology

## 2016-02-02 ENCOUNTER — Other Ambulatory Visit: Payer: Self-pay | Admitting: Obstetrics & Gynecology

## 2016-02-02 ENCOUNTER — Other Ambulatory Visit: Payer: Self-pay | Admitting: Obstetrics and Gynecology

## 2016-03-13 ENCOUNTER — Ambulatory Visit: Payer: Medicaid Other | Admitting: Obstetrics & Gynecology

## 2016-03-18 ENCOUNTER — Ambulatory Visit: Payer: Medicaid Other | Admitting: Obstetrics & Gynecology

## 2016-03-19 ENCOUNTER — Encounter: Payer: Self-pay | Admitting: Obstetrics & Gynecology

## 2016-03-19 ENCOUNTER — Other Ambulatory Visit: Payer: Self-pay | Admitting: Obstetrics & Gynecology

## 2016-03-19 ENCOUNTER — Ambulatory Visit (INDEPENDENT_AMBULATORY_CARE_PROVIDER_SITE_OTHER): Payer: Medicaid Other | Admitting: Obstetrics & Gynecology

## 2016-03-19 VITALS — BP 110/80 | HR 72 | Ht 59.0 in | Wt 151.0 lb

## 2016-03-19 DIAGNOSIS — N3010 Interstitial cystitis (chronic) without hematuria: Secondary | ICD-10-CM

## 2016-03-19 NOTE — Progress Notes (Signed)
Patient ID: Debra Lewis, female   DOB: 12/23/1975, 40 y.o.   MRN: [id number] Diagnosed with IC: years ago  Current Meds:  DMSO Dietary restrictions    Pt states her symptoms have been stable, no exacerbations, although not perfect Wants to continue on this cycle  Blood pressure 110/80, pulse 72, height 4\' 11"  (1.499 m), weight 151 lb (68.493 kg), last menstrual period 02/19/2016.    The external urethra meatus was prepped with betadine DMSO 50 cc was instilled in the usual fashion after the bladder was catheterized and emptied completely 50cc was instilled into the bladder without difficulty and the patient tolerated well She will refrain from voiding as long as possible  Follow up in 8 weeks, or as patient requests based on her symptom complex

## 2016-04-05 ENCOUNTER — Other Ambulatory Visit: Payer: Self-pay | Admitting: Obstetrics & Gynecology

## 2016-05-01 ENCOUNTER — Other Ambulatory Visit: Payer: Self-pay | Admitting: Obstetrics & Gynecology

## 2016-05-01 ENCOUNTER — Other Ambulatory Visit: Payer: Medicaid Other | Admitting: Obstetrics & Gynecology

## 2016-05-06 ENCOUNTER — Other Ambulatory Visit: Payer: Self-pay | Admitting: Obstetrics & Gynecology

## 2016-05-08 ENCOUNTER — Telehealth: Payer: Self-pay | Admitting: *Deleted

## 2016-05-08 NOTE — Telephone Encounter (Signed)
Pt states unable to get DMSO from [facility] for her bladder irrigation tomorrow. Pt informed to keep her appt as scheduled.

## 2016-05-09 ENCOUNTER — Ambulatory Visit: Payer: Medicaid Other | Admitting: Obstetrics & Gynecology

## 2016-05-09 ENCOUNTER — Ambulatory Visit (INDEPENDENT_AMBULATORY_CARE_PROVIDER_SITE_OTHER): Payer: Medicaid Other | Admitting: Obstetrics & Gynecology

## 2016-05-09 VITALS — BP 110/80 | HR 84 | Ht 59.0 in | Wt 153.0 lb

## 2016-05-09 DIAGNOSIS — N3010 Interstitial cystitis (chronic) without hematuria: Secondary | ICD-10-CM

## 2016-05-09 NOTE — Progress Notes (Signed)
Patient ID: Debra Lewis, female   DOB: 02/12/1976, 40 y.o.   MRN: [id number] Patient ID: Debra Reamsraci M Kruse, female   DOB: 05/24/1976, 40 y.o.   MRN: [id number] Diagnosed with IC: years ago  Current Meds:  DMSO Dietary restrictions    Pt states her symptoms have been stable, no exacerbations, although not perfect Wants to continue on this cycle  Blood pressure 110/80, pulse 84, height 4\' 11"  (1.499 m), weight 153 lb (69.4 kg), last menstrual period 04/10/2016.    The external urethra meatus was prepped with betadine DMSO 50 cc was instilled in the usual fashion after the bladder was catheterized and emptied completely 50cc was instilled into the bladder without difficulty and the patient tolerated well She will refrain from voiding as long as possible  Follow up in 8 weeks, or as patient requests based on her symptom complex

## 2016-05-13 ENCOUNTER — Ambulatory Visit: Payer: Medicaid Other | Admitting: Obstetrics & Gynecology

## 2016-05-14 ENCOUNTER — Ambulatory Visit: Payer: Medicaid Other | Admitting: Obstetrics & Gynecology

## 2016-05-23 ENCOUNTER — Other Ambulatory Visit: Payer: Medicaid Other | Admitting: Obstetrics & Gynecology

## 2016-05-29 ENCOUNTER — Other Ambulatory Visit: Payer: Medicaid Other | Admitting: Obstetrics & Gynecology

## 2016-06-07 ENCOUNTER — Other Ambulatory Visit: Payer: Medicaid Other | Admitting: Obstetrics & Gynecology

## 2016-06-18 ENCOUNTER — Other Ambulatory Visit (HOSPITAL_COMMUNITY)
Admission: RE | Admit: 2016-06-18 | Discharge: 2016-06-18 | Disposition: A | Discharge status: Home / Self Care | Payer: Medicaid Other | Source: Ambulatory Visit | Attending: Obstetrics & Gynecology | Admitting: Obstetrics & Gynecology

## 2016-06-18 ENCOUNTER — Encounter: Payer: Self-pay | Admitting: Obstetrics & Gynecology

## 2016-06-18 ENCOUNTER — Ambulatory Visit (INDEPENDENT_AMBULATORY_CARE_PROVIDER_SITE_OTHER): Payer: Medicaid Other | Admitting: Obstetrics & Gynecology

## 2016-06-18 VITALS — BP 100/80 | HR 80 | Ht 59.0 in | Wt 161.0 lb

## 2016-06-18 DIAGNOSIS — Z01419 Encounter for gynecological examination (general) (routine) without abnormal findings: Secondary | ICD-10-CM | POA: Diagnosis not present

## 2016-06-18 DIAGNOSIS — Z0000 Encounter for general adult medical examination without abnormal findings: Secondary | ICD-10-CM | POA: Diagnosis not present

## 2016-06-18 MED ORDER — LEVONORGESTREL-ETHINYL ESTRAD 0.15-30 MG-MCG PO TABS: ORAL_TABLET | ORAL | 11 refills | Status: DC

## 2016-06-18 NOTE — Progress Notes (Signed)
Subjective:     Debra Lewis is a 40 y.o. female here for a routine exam.  Patient's last menstrual period was 05/19/2016. G1P1001 Body mass index is 32.52 kg/m.  Birth Control Method:  OCP Menstrual Calendar(currently): regular  Current complaints: .   Current acute medical issues:  IC   Recent Gynecologic History Patient's last menstrual period was 05/19/2016. Last Pap: 2016,  normal Last mammogram: na,    Past Medical History:  Diagnosis Date  . Abnormal pap   . ADHD (attention deficit hyperactivity disorder)   . Anxiety   . Asthma   . Bacterial vaginosis   . Chronic constipation   . Depression   . History of frequent urinary tract infections   . HSV-2 (herpes simplex virus 2) infection   . Interstitial cystitis   . Migraines   . OCD (obsessive compulsive disorder)   . Pityriasis rosea     Past Surgical History:  Procedure Laterality Date  . conization of cervix      OB History    Gravida Para Term Preterm AB Living   1 1 1     1    SAB TAB Ectopic Multiple Live Births           1      Social History   Social History  . Marital status: Single    Spouse name: N/A  . Number of children: N/A  . Years of education: N/A   Social History Main Topics  . Smoking status: Never Smoker  . Smokeless tobacco: Never Used  . Alcohol use Yes     Comment: socially  . Drug use: No  . Sexual activity: Not Currently    Birth control/ protection: Pill   Other Topics Concern  . None   Social History Narrative  . None    Family History  Problem Relation Age of Onset  . Breast cancer Maternal Aunt   . Cancer    . Heart disease    . Diabetes    . Lung disease    . Lung cancer Sister   . Depression Mother   . Drug abuse Father      Current Outpatient Prescriptions:  .  albuterol (PROVENTIL HFA;VENTOLIN HFA) 108 (90 BASE) MCG/ACT inhaler, Inhale 2 puffs into the lungs every 6 (six) hours as needed for wheezing. Reported on 01/17/2016, Disp: , Rfl:  .   ALPRAZolam (XANAX) 1 MG tablet, Take 1 tablet (1 mg total) by mouth 3 (three) times daily as needed for sleep or anxiety., Disp: 90 tablet, Rfl: 2 .  amphetamine-dextroamphetamine (ADDERALL) 30 MG tablet, Take 30 mg by mouth 2 (two) times daily., Disp: , Rfl:  .  clobetasol cream (TEMOVATE) 0.05 %, APPLY TO AFFECTED AREAS AT BEDTIME., Disp: 30 g, Rfl: 11 .  cycloSPORINE (RESTASIS) 0.05 % ophthalmic emulsion, Place 1 drop into both eyes daily., Disp: , Rfl:  .  dimethyl sulfoxide 50 % solution, Place 50 mLs into the urethra once. To be done in office by Dr Despina HiddenEure, Disp: 50 mL, Rfl: 12 .  ELMIRON 100 MG capsule, TAKE (2) CAPSULES BY MOUTH TWICE DAILY., Disp: 120 capsule, Rfl: PRN .  fluconazole (DIFLUCAN) 150 MG tablet, USE AS DIRECTED., Disp: 2 tablet, Rfl: PRN .  fluticasone (FLONASE) 50 MCG/ACT nasal spray, Place 2 sprays into the nose daily., Disp: , Rfl:  .  ibuprofen (ADVIL,MOTRIN) 800 MG tablet, Take 800 mg by mouth daily as needed for pain (Associated with nerve damage)., Disp: , Rfl:  .  levonorgestrel-ethinyl estradiol (PORTIA-28) 0.15-30 MG-MCG tablet, TAKE (1) TABLET BY MOUTH ONCE DAILY AS DIRECTED., Disp: 28 tablet, Rfl: 11 .  Methen-Bella-Meth Bl-Phen Sal (URISED PO), Take 1 tablet by mouth daily as needed (for bladder pain relief). , Disp: , Rfl:  .  omeprazole (PRILOSEC) 20 MG capsule, TAKE (1) CAPSULE BY MOUTH EVERY DAY., Disp: 30 capsule, Rfl: 11 .  triamcinolone cream (KENALOG) 0.5 %, Apply 1 application topically daily as needed (FOR IRRITATION). , Disp: , Rfl:  .  URELLE (URELLE/URISED) 81 MG TABS tablet, TAKE 1 TABLET BY MOUTH FOUR TIMES DAILY., Disp: 120 tablet, Rfl: 3 .  hydrOXYzine (ATARAX/VISTARIL) 25 MG tablet, Take 25 mg by mouth daily as needed for itching. Reported on 05/09/2016, Disp: , Rfl:   Review of Systems  Review of Systems  Constitutional: Negative for fever, chills, weight loss, malaise/fatigue and diaphoresis.  HENT: Negative for hearing loss, ear pain,  nosebleeds, congestion, sore throat, neck pain, tinnitus and ear discharge.   Eyes: Negative for blurred vision, double vision, photophobia, pain, discharge and redness.  Respiratory: Negative for cough, hemoptysis, sputum production, shortness of breath, wheezing and stridor.   Cardiovascular: Negative for chest pain, palpitations, orthopnea, claudication, leg swelling and PND.  Gastrointestinal: negative for abdominal pain. Negative for heartburn, nausea, vomiting, diarrhea, constipation, blood in stool and melena.  Genitourinary: Negative for dysuria, urgency, frequency, hematuria and flank pain.  Musculoskeletal: Negative for myalgias, back pain, joint pain and falls.  Skin: Negative for itching and rash.  Neurological: Negative for dizziness, tingling, tremors, sensory change, speech change, focal weakness, seizures, loss of consciousness, weakness and headaches.  Endo/Heme/Allergies: Negative for environmental allergies and polydipsia. Does not bruise/bleed easily.  Psychiatric/Behavioral: Negative for depression, suicidal ideas, hallucinations, memory loss and substance abuse. The patient is not nervous/anxious and does not have insomnia.        Objective:  Blood pressure 100/80, pulse 80, height 4\' 11"  (1.499 m), weight 161 lb (73 kg), last menstrual period 05/19/2016.   Physical Exam  Vitals reviewed. Constitutional: She is oriented to person, place, and time. She appears well-developed and well-nourished.  HENT:  Head: Normocephalic and atraumatic.        Right Ear: External ear normal.  Left Ear: External ear normal.  Nose: Nose normal.  Mouth/Throat: Oropharynx is clear and moist.  Eyes: Conjunctivae and EOM are normal. Pupils are equal, round, and reactive to light. Right eye exhibits no discharge. Left eye exhibits no discharge. No scleral icterus.  Neck: Normal range of motion. Neck supple. No tracheal deviation present. No thyromegaly present.  Cardiovascular: Normal rate,  regular rhythm, normal heart sounds and intact distal pulses.  Exam reveals no gallop and no friction rub.   No murmur heard. Respiratory: Effort normal and breath sounds normal. No respiratory distress. She has no wheezes. She has no rales. She exhibits no tenderness.  GI: Soft. Bowel sounds are normal. She exhibits no distension and no mass. There is no tenderness. There is no rebound and no guarding.  Genitourinary:  Breasts no masses skin changes or nipple changes bilaterally      Vulva is normal without lesions Vagina is pink moist without discharge Cervix normal in appearance and pap is done Uterus is normal size shape and contour Adnexa is negative with normal sized ovaries   Musculoskeletal: Normal range of motion. She exhibits no edema and no tenderness.  Neurological: She is alert and oriented to person, place, and time. She has normal reflexes. She displays normal reflexes. No cranial  nerve deficit. She exhibits normal muscle tone. Coordination normal.  Skin: Skin is warm and dry. No rash noted. No erythema. No pallor.  Psychiatric: She has a normal mood and affect. Her behavior is normal. Judgment and thought content normal.       Medications Ordered at today's visit: Meds ordered this encounter  Medications  . levonorgestrel-ethinyl estradiol (PORTIA-28) 0.15-30 MG-MCG tablet    Sig: TAKE (1) TABLET BY MOUTH ONCE DAILY AS DIRECTED.    Dispense:  28 tablet    Refill:  11    Other orders placed at today's visit: No orders of the defined types were placed in this encounter.     Assessment:    Healthy female exam.    Plan:    Contraception: OCP (estrogen/progesterone). Mammogram ordered. Follow up in: 1 year.   yearly Per schedule for IC therapy    Return for keep scheduled.

## 2016-06-20 LAB — CYTOLOGY - PAP

## 2016-07-04 ENCOUNTER — Ambulatory Visit: Payer: Medicaid Other | Admitting: Obstetrics & Gynecology

## 2016-07-16 ENCOUNTER — Ambulatory Visit: Payer: Medicaid Other | Admitting: Obstetrics & Gynecology

## 2016-09-14 ENCOUNTER — Emergency Department (HOSPITAL_COMMUNITY): Payer: Medicaid Other

## 2016-09-14 ENCOUNTER — Emergency Department (HOSPITAL_COMMUNITY)
Admission: EM | Admit: 2016-09-14 | Discharge: 2016-09-15 | Disposition: A | Discharge status: Home / Self Care | Payer: Medicaid Other | Attending: Emergency Medicine | Admitting: Emergency Medicine

## 2016-09-14 ENCOUNTER — Encounter (HOSPITAL_COMMUNITY): Payer: Self-pay | Admitting: Emergency Medicine

## 2016-09-14 DIAGNOSIS — W010XXA Fall on same level from slipping, tripping and stumbling without subsequent striking against object, initial encounter: Secondary | ICD-10-CM | POA: Insufficient documentation

## 2016-09-14 DIAGNOSIS — S4991XA Unspecified injury of right shoulder and upper arm, initial encounter: Secondary | ICD-10-CM | POA: Diagnosis present

## 2016-09-14 DIAGNOSIS — J45909 Unspecified asthma, uncomplicated: Secondary | ICD-10-CM | POA: Diagnosis not present

## 2016-09-14 DIAGNOSIS — Y929 Unspecified place or not applicable: Secondary | ICD-10-CM | POA: Diagnosis not present

## 2016-09-14 DIAGNOSIS — Y939 Activity, unspecified: Secondary | ICD-10-CM | POA: Insufficient documentation

## 2016-09-14 DIAGNOSIS — Z791 Long term (current) use of non-steroidal anti-inflammatories (NSAID): Secondary | ICD-10-CM | POA: Insufficient documentation

## 2016-09-14 DIAGNOSIS — Y999 Unspecified external cause status: Secondary | ICD-10-CM | POA: Diagnosis not present

## 2016-09-14 DIAGNOSIS — S42451A Displaced fracture of lateral condyle of right humerus, initial encounter for closed fracture: Secondary | ICD-10-CM | POA: Insufficient documentation

## 2016-09-14 DIAGNOSIS — Z79899 Other long term (current) drug therapy: Secondary | ICD-10-CM | POA: Insufficient documentation

## 2016-09-14 DIAGNOSIS — F909 Attention-deficit hyperactivity disorder, unspecified type: Secondary | ICD-10-CM | POA: Diagnosis not present

## 2016-09-14 LAB — POC URINE PREG, ED: PREG TEST UR: NEGATIVE

## 2016-09-14 MED ORDER — PROMETHAZINE HCL 25 MG PO TABS: 25.0000 mg | ORAL_TABLET | Freq: Four times a day (QID) | ORAL | 0 refills | Status: DC | PRN

## 2016-09-14 MED ORDER — KETOROLAC TROMETHAMINE 60 MG/2ML IM SOLN
30.0000 mg | Freq: Once | INTRAMUSCULAR | Status: AC
  Administered 2016-09-14: 30 mg via INTRAMUSCULAR
  Filled 2016-09-14: qty 2

## 2016-09-14 MED ORDER — PROMETHAZINE HCL 12.5 MG PO TABS
25.0000 mg | ORAL_TABLET | Freq: Once | ORAL | Status: AC
  Administered 2016-09-15: 25 mg via ORAL
  Filled 2016-09-14: qty 2

## 2016-09-14 MED ORDER — IBUPROFEN 800 MG PO TABS
800.0000 mg | ORAL_TABLET | Freq: Once | ORAL | Status: AC
  Administered 2016-09-14: 800 mg via ORAL
  Filled 2016-09-14: qty 1

## 2016-09-14 MED ORDER — TRAMADOL HCL 50 MG PO TABS: 50.0000 mg | ORAL_TABLET | Freq: Four times a day (QID) | ORAL | 0 refills | Status: DC | PRN

## 2016-09-14 NOTE — ED Triage Notes (Signed)
Pt reports falling shortly PTA. Pt c/o R hand,wrist and arm pain, up to the shoulder.

## 2016-09-14 NOTE — ED Notes (Signed)
Pt report she fell onto R side onto the floor PTA. States shoulder, elbow, wrist, and hand pain. No deformity noted.

## 2016-09-14 NOTE — Discharge Instructions (Signed)
Call one of the orthopedic doctors listed on Monday to arrange a follow-up appt. Keep the elbow splinted.  Return for any worsening symptoms

## 2016-09-14 NOTE — ED Provider Notes (Signed)
[facility]-EMERGENCY DEPT Provider Note   CSN: [id number] Arrival date & time: 09/14/16  2029     History   Chief Complaint Chief Complaint  Patient presents with  . Fall    HPI Debra Lewis is a 40 y.o. female.  HPI   Debra Lewis is a 40 y.o. female who presents to the Emergency Department complaining of right shoulder, elbow and wrist pain.  She states that she slipped and fell onto the floor landing on her right arm.  She describes a throbbing pain focused at the elbow and reports difficulty with extending her arm.  Injury occurred less than one hour prior to arrival.  She has not applied ice and taken any medication for pain.  She denies head injury, neck pain, LOC, back pain, swelling or numbness to the extremity.   Past Medical History:  Diagnosis Date  . Abnormal pap   . ADHD (attention deficit hyperactivity disorder)   . Anxiety   . Asthma   . Bacterial vaginosis   . Chronic constipation   . Depression   . History of frequent urinary tract infections   . HSV-2 (herpes simplex virus 2) infection   . Interstitial cystitis   . Migraines   . OCD (obsessive compulsive disorder)   . Pityriasis rosea     Patient Active Problem List   Diagnosis Date Noted  . Urinary tract infection, site not specified 04/16/2013  . Chronic interstitial cystitis 03/03/2013  . 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 WRIST PAIN, BILATERAL 12/26/2009  . KNEE SPRAIN 12/26/2009    Past Surgical History:  Procedure Laterality Date  . conization of cervix      OB History    Gravida Para Term Preterm AB Living   1 1 1     1    SAB TAB Ectopic Multiple Live Births           1       Home Medications    Prior to Admission medications   Medication Sig Start Date End Date Taking? Authorizing Provider  albuterol (PROVENTIL HFA;VENTOLIN HFA) 108 (90 BASE) MCG/ACT inhaler Inhale 2 puffs into the lungs every 6 (six) hours as needed for wheezing. Reported on 01/17/2016    Historical  Provider, MD  ALPRAZolam Prudy Feeler(XANAX) 1 MG tablet Take 1 tablet (1 mg total) by mouth 3 (three) times daily as needed for sleep or anxiety. 03/30/15   Myrlene Brokereborah R Ross, MD  amphetamine-dextroamphetamine (ADDERALL) 30 MG tablet Take 30 mg by mouth 2 (two) times daily.    Historical Provider, MD  clobetasol cream (TEMOVATE) 0.05 % APPLY TO AFFECTED AREAS AT BEDTIME. 04/08/16   Lazaro ArmsLuther H Eure, MD  cycloSPORINE (RESTASIS) 0.05 % ophthalmic emulsion Place 1 drop into both eyes daily.    Historical Provider, MD  dimethyl sulfoxide 50 % solution Place 50 mLs into the urethra once. To be done in office by Dr Despina HiddenEure 10/05/15   Lazaro ArmsLuther H Eure, MD  ELMIRON 100 MG capsule TAKE (2) CAPSULES BY MOUTH TWICE DAILY. 05/02/16   Lazaro ArmsLuther H Eure, MD  fluconazole (DIFLUCAN) 150 MG tablet USE AS DIRECTED. 02/04/16   Lazaro ArmsLuther H Eure, MD  fluticasone (FLONASE) 50 MCG/ACT nasal spray Place 2 sprays into the nose daily.    Historical Provider, MD  hydrOXYzine (ATARAX/VISTARIL) 25 MG tablet Take 25 mg by mouth daily as needed for itching. Reported on 05/09/2016    Historical Provider, MD  ibuprofen (ADVIL,MOTRIN) 800 MG tablet Take 800 mg by mouth daily as needed  for pain (Associated with nerve damage).    Historical Provider, MD  levonorgestrel-ethinyl estradiol (PORTIA-28) 0.15-30 MG-MCG tablet TAKE (1) TABLET BY MOUTH ONCE DAILY AS DIRECTED. 06/18/16   Lazaro Arms, MD  omeprazole (PRILOSEC) 20 MG capsule TAKE (1) CAPSULE BY MOUTH EVERY DAY. 03/19/16   Lazaro Arms, MD  triamcinolone cream (KENALOG) 0.5 % Apply 1 application topically daily as needed (FOR IRRITATION).     Historical Provider, MD  URELLE (URELLE/URISED) 81 MG TABS tablet TAKE 1 TABLET BY MOUTH FOUR TIMES DAILY. 05/02/16   Lazaro Arms, MD    Family History Family History  Problem Relation Age of Onset  . Breast cancer Maternal Aunt   . Cancer    . Heart disease    . Diabetes    . Lung disease    . Lung cancer Sister   . Depression Mother   . Drug abuse Father      Social History Social History  Substance Use Topics  . Smoking status: Never Smoker  . Smokeless tobacco: Never Used  . Alcohol use Yes     Comment: socially     Allergies   Metrogel [metronidazole]; Penicillins; Codeine; Flagyl [metronidazole hcl]; and Sulfonamide derivatives   Review of Systems Review of Systems  Constitutional: Negative for chills and fever.  Respiratory: Negative for shortness of breath.   Cardiovascular: Negative for chest pain.  Musculoskeletal: Positive for arthralgias (right wrist, elbow and shoulder pain.). Negative for joint swelling.  Skin: Negative for color change and wound.  Neurological: Negative for syncope, weakness, numbness and headaches.  All other systems reviewed and are negative.    Physical Exam Updated Vital Signs BP 129/79 (BP Location: Left Arm)   Pulse 98   Temp 98.5 F (36.9 C) (Oral)   Resp 20   Ht 4\' 11"  (1.499 m)   Wt 78.7 kg   LMP 06/14/2016   SpO2 100%   BMI 35.04 kg/m   Physical Exam  Constitutional: She is oriented to person, place, and time. She appears well-developed and well-nourished. No distress.  Neck: Normal range of motion. Neck supple.  Cardiovascular: Normal rate, regular rhythm and intact distal pulses.   Pulmonary/Chest: Effort normal and breath sounds normal. She exhibits no tenderness.  Musculoskeletal: Normal range of motion. She exhibits tenderness. She exhibits no edema or deformity.  ttp of the lateral right elbow.  Pain with extension.  No edema or bony deformity noted.  Also ttp of the distal right wrist and anterior right shoulder.  No bony deformities.  Radial pulse brisk, sensation intact  Neurological: She is alert and oriented to person, place, and time.  Skin: Skin is warm. Capillary refill takes less than 2 seconds. No rash noted.  Psychiatric: She has a normal mood and affect.  Nursing note and vitals reviewed.    ED Treatments / Results  Labs (all labs ordered are listed,  but only abnormal results are displayed) Labs Reviewed  POC URINE PREG, ED    EKG  EKG Interpretation None       Radiology Dg Shoulder Right  Result Date: 09/14/2016 CLINICAL DATA:  Acute onset of right shoulder pain after fall. Initial encounter. EXAM: RIGHT SHOULDER - 2+ VIEW COMPARISON:  Chest radiograph performed 02/13/2013 FINDINGS: There is no evidence of fracture or dislocation. The right humeral head is seated within the glenoid fossa. The acromioclavicular joint is unremarkable in appearance. No significant soft tissue abnormalities are seen. The visualized portions of the right lung are clear.  IMPRESSION: No evidence of fracture or dislocation. Electronically Signed   By: Roanna RaiderJeffery  Chang M.D.   On: 09/14/2016 22:09   Dg Elbow Complete Right  Result Date: 09/14/2016 CLINICAL DATA:  Acute onset of right elbow pain.  Initial encounter. EXAM: RIGHT ELBOW - COMPLETE 3+ VIEW COMPARISON:  None. FINDINGS: There appears to be a large 1.8 cm mildly comminuted fracture fragment arising from the lateral humeral condyle, demonstrating volar displacement and angulation, with an associated elbow joint effusion. IMPRESSION: Large 1.8 cm mildly comminuted fracture fragment arising from the lateral humeral condyle, demonstrating volar displacement and angulation, with an associated elbow joint effusion. Electronically Signed   By: Roanna RaiderJeffery  Chang M.D.   On: 09/14/2016 22:08   Dg Wrist Complete Right  Result Date: 09/14/2016 CLINICAL DATA:  Acute onset of right ulnar wrist pain. Initial encounter. EXAM: RIGHT WRIST - COMPLETE 3+ VIEW COMPARISON:  None. FINDINGS: There is no evidence of fracture or dislocation. The carpal rows are intact, and demonstrate normal alignment. The joint spaces are preserved. No significant soft tissue abnormalities are seen. IMPRESSION: No evidence of fracture or dislocation. Electronically Signed   By: Roanna RaiderJeffery  Chang M.D.   On: 09/14/2016 22:03    Procedures Procedures  (including critical care time)  Medications Ordered in ED Medications  ibuprofen (ADVIL,MOTRIN) tablet 800 mg (800 mg Oral Given 09/14/16 2058)     Initial Impression / Assessment and Plan / ED Course  I have reviewed the triage vital signs and the nursing notes.  Pertinent labs & imaging results that were available during my care of the patient were reviewed by me and considered in my medical decision making (see chart for details).  Clinical Course     NV intact.  XR results discussed with Dr. Patria Maneampos and with patient.    Posterior long arm splint applied.  Sling.  Pt agrees to close orthopedic f/u. Pt declines Rx for narcotic medication, agrees to tramadol and will continue ibuprofen 800 mg.    On recheck, NV intact.  Pain improved.  Appears stable for d/c  Final Clinical Impressions(s) / ED Diagnoses   Final diagnoses:  Displaced fracture of lateral condyle of right humerus, initial encounter for closed fracture    New Prescriptions New Prescriptions   No medications on file     Rosey Bathammy Dellene Mcgroarty, PA-C 09/15/16 2327    Azalia BilisKevin Campos, MD 09/17/16 [phone]

## 2016-09-16 ENCOUNTER — Ambulatory Visit (INDEPENDENT_AMBULATORY_CARE_PROVIDER_SITE_OTHER): Payer: Medicaid Other | Admitting: Orthopedic Surgery

## 2016-09-16 VITALS — BP 123/87 | HR 114 | Ht 59.0 in | Wt 161.0 lb

## 2016-09-16 DIAGNOSIS — S42451A Displaced fracture of lateral condyle of right humerus, initial encounter for closed fracture: Secondary | ICD-10-CM

## 2016-09-16 NOTE — Progress Notes (Signed)
Patient ID: Debra Lewis, female   DOB: 10/25/1976, 40 y.o.   MRN: [id number]  Chief Complaint  Patient presents with  . Elbow Injury    RT elbow fracture, DOI 09/14/16    HPI Debra Lewis is a 40 y.o. female.  2 day history of fall onto the right elbow complains of appropriate level of dull aching pain without numbness or tingling. Placed in a splint.  Review of Systems Review of Systems  Constitutional: Negative for fever.  Respiratory: Negative for shortness of breath.   Cardiovascular: Negative for chest pain.  Neurological: Negative for numbness.     Past Medical History:  Diagnosis Date  . Abnormal pap   . ADHD (attention deficit hyperactivity disorder)   . Anxiety   . Asthma   . Bacterial vaginosis   . Chronic constipation   . Depression   . History of frequent urinary tract infections   . HSV-2 (herpes simplex virus 2) infection   . Interstitial cystitis   . Migraines   . OCD (obsessive compulsive disorder)   . Pityriasis rosea     Past Surgical History:  Procedure Laterality Date  . conization of cervix      Social History Social History  Substance Use Topics  . Smoking status: Never Smoker  . Smokeless tobacco: Never Used  . Alcohol use Yes     Comment: socially    Allergies  Allergen Reactions  . Metrogel [Metronidazole]   . Penicillins Nausea Only    Sick on stomach  . Codeine Rash    Hives and itching  . Flagyl [Metronidazole Hcl] Nausea And Vomiting, Swelling and Rash  . Sulfonamide Derivatives Swelling and Rash    No outpatient prescriptions have been marked as taking for the 09/16/16 encounter (Office Visit) with Vickki HearingStanley E Harrison, MD.      Physical Exam Physical Exam BP 123/87   Pulse (!) 114   Ht 4\' 11"  (1.499 m)   Wt 161 lb (73 kg)   LMP 06/14/2016   BMI 32.52 kg/m   Gen. appearance. The patient is well-developed and well-nourished, grooming and hygiene are normal. There are no gross congenital abnormalities  The  patient is alert and oriented to person place and time  Mood and affect are normal  Ambulation Normal  Examination reveals the following: On inspection we find splinted right elbow which she said was loose so we have to  Range of motion shoulder was normal fingers normal  Stability tests were deferred no weakness was detected in the right upper extremity could not see the skin the sensation in the fingers is normal the capillary refill is normal   Data Reviewed Multiple views of the elbow show a capitellar fracture type I displaced with some comminution  Assessment Encounter Diagnosis  Name Primary?  . Closed fracture of capitellum of distal humerus, right, initial encounter Yes         Plan    Referral to appropriate elbow specialist       Fuller CanadaStanley Harrison 09/16/2016, 4:24 PM

## 2016-09-16 NOTE — Addendum Note (Signed)
Addended by: Adella HareBOOTHE, Keaira Whitehurst B on: 09/16/2016 04:44 PM   Modules accepted: Orders

## 2016-09-16 NOTE — Patient Instructions (Signed)
Referral to Dr. Dorathy KinsmanSypher/Kuzma

## 2016-09-24 ENCOUNTER — Telehealth: Payer: Self-pay | Admitting: Obstetrics & Gynecology

## 2016-09-24 ENCOUNTER — Other Ambulatory Visit: Payer: Self-pay | Admitting: Orthopedic Surgery

## 2016-09-24 ENCOUNTER — Encounter: Payer: Self-pay | Admitting: Obstetrics & Gynecology

## 2016-09-24 DIAGNOSIS — S42454A Nondisplaced fracture of lateral condyle of right humerus, initial encounter for closed fracture: Secondary | ICD-10-CM

## 2016-09-24 DIAGNOSIS — S62024A Nondisplaced fracture of middle third of navicular [scaphoid] bone of right wrist, initial encounter for closed fracture: Secondary | ICD-10-CM

## 2016-09-25 ENCOUNTER — Ambulatory Visit
Admission: RE | Admit: 2016-09-25 | Discharge: 2016-09-25 | Disposition: A | Discharge status: Home / Self Care | Payer: Medicaid Other | Source: Ambulatory Visit | Attending: Orthopedic Surgery | Admitting: Orthopedic Surgery

## 2016-09-25 ENCOUNTER — Encounter (HOSPITAL_BASED_OUTPATIENT_CLINIC_OR_DEPARTMENT_OTHER): Payer: Self-pay | Admitting: *Deleted

## 2016-09-25 ENCOUNTER — Other Ambulatory Visit: Payer: Self-pay | Admitting: Orthopedic Surgery

## 2016-09-25 DIAGNOSIS — S62024A Nondisplaced fracture of middle third of navicular [scaphoid] bone of right wrist, initial encounter for closed fracture: Secondary | ICD-10-CM

## 2016-09-25 DIAGNOSIS — S42454A Nondisplaced fracture of lateral condyle of right humerus, initial encounter for closed fracture: Secondary | ICD-10-CM

## 2016-09-27 ENCOUNTER — Other Ambulatory Visit: Payer: Self-pay | Admitting: Orthopedic Surgery

## 2016-10-01 ENCOUNTER — Ambulatory Visit (HOSPITAL_BASED_OUTPATIENT_CLINIC_OR_DEPARTMENT_OTHER)
Admission: RE | Admit: 2016-10-01 | Discharge: 2016-10-01 | Disposition: A | Discharge status: Home / Self Care | Payer: Medicaid Other | Source: Ambulatory Visit | Attending: Orthopedic Surgery | Admitting: Orthopedic Surgery

## 2016-10-01 ENCOUNTER — Ambulatory Visit (HOSPITAL_BASED_OUTPATIENT_CLINIC_OR_DEPARTMENT_OTHER): Payer: Medicaid Other | Admitting: Anesthesiology

## 2016-10-01 ENCOUNTER — Encounter (HOSPITAL_BASED_OUTPATIENT_CLINIC_OR_DEPARTMENT_OTHER)
Admission: RE | Disposition: A | Discharge status: Home / Self Care | Payer: Self-pay | Source: Ambulatory Visit | Attending: Orthopedic Surgery

## 2016-10-01 ENCOUNTER — Encounter (HOSPITAL_BASED_OUTPATIENT_CLINIC_OR_DEPARTMENT_OTHER): Payer: Self-pay | Admitting: Anesthesiology

## 2016-10-01 DIAGNOSIS — G43909 Migraine, unspecified, not intractable, without status migrainosus: Secondary | ICD-10-CM | POA: Insufficient documentation

## 2016-10-01 DIAGNOSIS — Z818 Family history of other mental and behavioral disorders: Secondary | ICD-10-CM | POA: Diagnosis not present

## 2016-10-01 DIAGNOSIS — Z8249 Family history of ischemic heart disease and other diseases of the circulatory system: Secondary | ICD-10-CM | POA: Insufficient documentation

## 2016-10-01 DIAGNOSIS — Z882 Allergy status to sulfonamides status: Secondary | ICD-10-CM | POA: Diagnosis not present

## 2016-10-01 DIAGNOSIS — Z801 Family history of malignant neoplasm of trachea, bronchus and lung: Secondary | ICD-10-CM | POA: Diagnosis not present

## 2016-10-01 DIAGNOSIS — F909 Attention-deficit hyperactivity disorder, unspecified type: Secondary | ICD-10-CM | POA: Diagnosis not present

## 2016-10-01 DIAGNOSIS — K5909 Other constipation: Secondary | ICD-10-CM | POA: Diagnosis not present

## 2016-10-01 DIAGNOSIS — Z885 Allergy status to narcotic agent status: Secondary | ICD-10-CM | POA: Insufficient documentation

## 2016-10-01 DIAGNOSIS — Z87440 Personal history of urinary (tract) infections: Secondary | ICD-10-CM | POA: Insufficient documentation

## 2016-10-01 DIAGNOSIS — J45909 Unspecified asthma, uncomplicated: Secondary | ICD-10-CM | POA: Diagnosis not present

## 2016-10-01 DIAGNOSIS — Z888 Allergy status to other drugs, medicaments and biological substances status: Secondary | ICD-10-CM | POA: Diagnosis not present

## 2016-10-01 DIAGNOSIS — L42 Pityriasis rosea: Secondary | ICD-10-CM | POA: Insufficient documentation

## 2016-10-01 DIAGNOSIS — Z880 Allergy status to penicillin: Secondary | ICD-10-CM | POA: Diagnosis not present

## 2016-10-01 DIAGNOSIS — F429 Obsessive-compulsive disorder, unspecified: Secondary | ICD-10-CM | POA: Diagnosis not present

## 2016-10-01 DIAGNOSIS — N3010 Interstitial cystitis (chronic) without hematuria: Secondary | ICD-10-CM | POA: Diagnosis not present

## 2016-10-01 DIAGNOSIS — F329 Major depressive disorder, single episode, unspecified: Secondary | ICD-10-CM | POA: Diagnosis not present

## 2016-10-01 DIAGNOSIS — W19XXXA Unspecified fall, initial encounter: Secondary | ICD-10-CM | POA: Diagnosis not present

## 2016-10-01 DIAGNOSIS — S42451A Displaced fracture of lateral condyle of right humerus, initial encounter for closed fracture: Secondary | ICD-10-CM | POA: Insufficient documentation

## 2016-10-01 DIAGNOSIS — F419 Anxiety disorder, unspecified: Secondary | ICD-10-CM | POA: Insufficient documentation

## 2016-10-01 DIAGNOSIS — Z79899 Other long term (current) drug therapy: Secondary | ICD-10-CM | POA: Insufficient documentation

## 2016-10-01 DIAGNOSIS — Z803 Family history of malignant neoplasm of breast: Secondary | ICD-10-CM | POA: Insufficient documentation

## 2016-10-01 DIAGNOSIS — Z833 Family history of diabetes mellitus: Secondary | ICD-10-CM | POA: Insufficient documentation

## 2016-10-01 DIAGNOSIS — K219 Gastro-esophageal reflux disease without esophagitis: Secondary | ICD-10-CM | POA: Diagnosis not present

## 2016-10-01 HISTORY — DX: Gastro-esophageal reflux disease without esophagitis: K21.9

## 2016-10-01 HISTORY — PX: ORIF ELBOW FRACTURE: SHX5031

## 2016-10-01 SURGERY — OPEN REDUCTION INTERNAL FIXATION (ORIF) ELBOW/OLECRANON FRACTURE
Anesthesia: Monitor Anesthesia Care | Site: Elbow | Laterality: Right

## 2016-10-01 MED ORDER — OXYCODONE-ACETAMINOPHEN 5-325 MG PO TABS: ORAL_TABLET | ORAL | 0 refills | Status: DC

## 2016-10-01 MED ORDER — PROPOFOL 10 MG/ML IV BOLUS
INTRAVENOUS | Status: DC | PRN
  Administered 2016-10-01 (×2): 40 mg via INTRAVENOUS

## 2016-10-01 MED ORDER — PROPOFOL 10 MG/ML IV BOLUS
INTRAVENOUS | Status: AC
  Filled 2016-10-01: qty 20

## 2016-10-01 MED ORDER — MIDAZOLAM HCL 2 MG/2ML IJ SOLN
1.0000 mg | INTRAMUSCULAR | Status: DC | PRN
  Administered 2016-10-01 (×2): 2 mg via INTRAVENOUS

## 2016-10-01 MED ORDER — FENTANYL CITRATE (PF) 100 MCG/2ML IJ SOLN
INTRAMUSCULAR | Status: AC
  Filled 2016-10-01: qty 2

## 2016-10-01 MED ORDER — ONDANSETRON HCL 4 MG PO TABS: 4.0000 mg | ORAL_TABLET | Freq: Three times a day (TID) | ORAL | 0 refills | Status: DC | PRN

## 2016-10-01 MED ORDER — HYDROMORPHONE HCL 1 MG/ML IJ SOLN: 0.2500 mg | INTRAMUSCULAR | Status: DC | PRN

## 2016-10-01 MED ORDER — VANCOMYCIN HCL IN DEXTROSE 1-5 GM/200ML-% IV SOLN
INTRAVENOUS | Status: AC
  Filled 2016-10-01: qty 200

## 2016-10-01 MED ORDER — FENTANYL CITRATE (PF) 100 MCG/2ML IJ SOLN
50.0000 ug | INTRAMUSCULAR | Status: AC | PRN
  Administered 2016-10-01: 100 ug via INTRAVENOUS
  Administered 2016-10-01 (×2): 50 ug via INTRAVENOUS

## 2016-10-01 MED ORDER — MIDAZOLAM HCL 2 MG/2ML IJ SOLN
INTRAMUSCULAR | Status: AC
  Filled 2016-10-01: qty 2

## 2016-10-01 MED ORDER — ONDANSETRON HCL 4 MG/2ML IJ SOLN
4.0000 mg | Freq: Once | INTRAMUSCULAR | Status: AC | PRN
  Administered 2016-10-01: 4 mg via INTRAVENOUS

## 2016-10-01 MED ORDER — VANCOMYCIN HCL IN DEXTROSE 1-5 GM/200ML-% IV SOLN
1000.0000 mg | INTRAVENOUS | Status: AC
  Administered 2016-10-01: 1000 mg via INTRAVENOUS

## 2016-10-01 MED ORDER — CHLORHEXIDINE GLUCONATE 4 % EX LIQD: 60.0000 mL | Freq: Once | CUTANEOUS | Status: DC

## 2016-10-01 MED ORDER — LIDOCAINE 2% (20 MG/ML) 5 ML SYRINGE
INTRAMUSCULAR | Status: AC
  Filled 2016-10-01: qty 5

## 2016-10-01 MED ORDER — SCOPOLAMINE 1 MG/3DAYS TD PT72: 1.0000 | MEDICATED_PATCH | Freq: Once | TRANSDERMAL | Status: DC | PRN

## 2016-10-01 MED ORDER — MEPERIDINE HCL 25 MG/ML IJ SOLN: 6.2500 mg | INTRAMUSCULAR | Status: DC | PRN

## 2016-10-01 MED ORDER — PROPOFOL 500 MG/50ML IV EMUL
INTRAVENOUS | Status: DC | PRN
  Administered 2016-10-01: 50 ug/kg/min via INTRAVENOUS

## 2016-10-01 MED ORDER — BUPIVACAINE-EPINEPHRINE (PF) 0.5% -1:200000 IJ SOLN
INTRAMUSCULAR | Status: DC | PRN
  Administered 2016-10-01: 30 mL via PERINEURAL

## 2016-10-01 MED ORDER — LACTATED RINGERS IV SOLN
INTRAVENOUS | Status: DC
  Administered 2016-10-01: 15:00:00 via INTRAVENOUS

## 2016-10-01 SURGICAL SUPPLY — 70 items
APL SKNCLS STERI-STRIP NONHPOA (GAUZE/BANDAGES/DRESSINGS)
BANDAGE ACE 3X5.8 VEL STRL LF (GAUZE/BANDAGES/DRESSINGS) ×6 IMPLANT
BENZOIN TINCTURE PRP APPL 2/3 (GAUZE/BANDAGES/DRESSINGS) IMPLANT
BIT DRILL ACUTRAK 2 MINI (BIT) IMPLANT
BLADE MINI RND TIP GREEN BEAV (BLADE) IMPLANT
BLADE SURG 15 STRL LF DISP TIS (BLADE) ×2 IMPLANT
BLADE SURG 15 STRL SS (BLADE) ×6
BNDG CMPR 9X4 STRL LF SNTH (GAUZE/BANDAGES/DRESSINGS) ×1
BNDG ESMARK 4X9 LF (GAUZE/BANDAGES/DRESSINGS) ×3 IMPLANT
BNDG GAUZE ELAST 4 BULKY (GAUZE/BANDAGES/DRESSINGS) ×3 IMPLANT
CHLORAPREP W/TINT 26ML (MISCELLANEOUS) ×3 IMPLANT
CLOSURE WOUND 1/2 X4 (GAUZE/BANDAGES/DRESSINGS) ×1
CORDS BIPOLAR (ELECTRODE) ×3 IMPLANT
COVER BACK TABLE 60X90IN (DRAPES) ×3 IMPLANT
COVER MAYO STAND STRL (DRAPES) ×3 IMPLANT
CUFF TOURN SGL LL 18 NRW (TOURNIQUET CUFF) ×3 IMPLANT
DRAPE EXTREMITY T 121X128X90 (DRAPE) ×3 IMPLANT
DRAPE OEC MINIVIEW 54X84 (DRAPES) ×3 IMPLANT
DRAPE SURG 17X23 STRL (DRAPES) ×3 IMPLANT
DRILL ACUTRAK 2 MINI (BIT) ×3
DRSG PAD ABDOMINAL 8X10 ST (GAUZE/BANDAGES/DRESSINGS) ×3 IMPLANT
GAUZE SPONGE 4X4 12PLY STRL (GAUZE/BANDAGES/DRESSINGS) ×3 IMPLANT
GAUZE XEROFORM 1X8 LF (GAUZE/BANDAGES/DRESSINGS) ×3 IMPLANT
GLOVE BIO SURGEON STRL SZ7.5 (GLOVE) ×3 IMPLANT
GLOVE BIOGEL PI IND STRL 7.0 (GLOVE) IMPLANT
GLOVE BIOGEL PI IND STRL 8 (GLOVE) ×1 IMPLANT
GLOVE BIOGEL PI IND STRL 8.5 (GLOVE) IMPLANT
GLOVE BIOGEL PI INDICATOR 7.0 (GLOVE) ×2
GLOVE BIOGEL PI INDICATOR 8 (GLOVE) ×4
GLOVE BIOGEL PI INDICATOR 8.5 (GLOVE) ×2
GLOVE SURG ORTHO 8.0 STRL STRW (GLOVE) ×2 IMPLANT
GLOVE SURG SYN 7.5  E (GLOVE) ×2
GLOVE SURG SYN 7.5 E (GLOVE) ×1 IMPLANT
GLOVE SURG SYN 7.5 PF PI (GLOVE) IMPLANT
GOWN STRL REUS W/ TWL LRG LVL3 (GOWN DISPOSABLE) ×1 IMPLANT
GOWN STRL REUS W/TWL 2XL LVL3 (GOWN DISPOSABLE) ×2 IMPLANT
GOWN STRL REUS W/TWL LRG LVL3 (GOWN DISPOSABLE) ×3
GOWN STRL REUS W/TWL XL LVL3 (GOWN DISPOSABLE) ×5 IMPLANT
GUIDEWIRE ORTHO MINI ACTK .045 (WIRE) ×4 IMPLANT
NDL HYPO 25X1 1.5 SAFETY (NEEDLE) IMPLANT
NEEDLE HYPO 25X1 1.5 SAFETY (NEEDLE) IMPLANT
PACK BASIN DAY SURGERY FS (CUSTOM PROCEDURE TRAY) ×3 IMPLANT
PAD CAST 3X4 CTTN HI CHSV (CAST SUPPLIES) IMPLANT
PAD CAST 4YDX4 CTTN HI CHSV (CAST SUPPLIES) ×1 IMPLANT
PADDING CAST ABS 4INX4YD NS (CAST SUPPLIES) ×2
PADDING CAST ABS COTTON 4X4 ST (CAST SUPPLIES) ×1 IMPLANT
PADDING CAST COTTON 3X4 STRL (CAST SUPPLIES)
PADDING CAST COTTON 4X4 STRL (CAST SUPPLIES) ×3
SCREW ACUTRAK 2 MINI 22MM (Screw) ×4 IMPLANT
SCREW ACUTRAK 2 MINI 24MM (Screw) ×2 IMPLANT
SLEEVE SCD COMPRESS KNEE MED (MISCELLANEOUS) IMPLANT
SPLINT PLASTER CAST XFAST 3X15 (CAST SUPPLIES) ×30 IMPLANT
SPLINT PLASTER XTRA FASTSET 3X (CAST SUPPLIES) ×60
SPONGE LAP 4X18 X RAY DECT (DISPOSABLE) ×3 IMPLANT
STOCKINETTE 4X48 STRL (DRAPES) ×3 IMPLANT
STRIP CLOSURE SKIN 1/2X4 (GAUZE/BANDAGES/DRESSINGS) ×1 IMPLANT
SUCTION FRAZIER HANDLE 10FR (MISCELLANEOUS) ×2
SUCTION TUBE FRAZIER 10FR DISP (MISCELLANEOUS) IMPLANT
SUT ETHILON 3 0 PS 1 (SUTURE) IMPLANT
SUT ETHILON 4 0 PS 2 18 (SUTURE) ×2 IMPLANT
SUT MNCRL AB 4-0 PS2 18 (SUTURE) ×3 IMPLANT
SUT VIC AB 3-0 PS1 18 (SUTURE) ×3
SUT VIC AB 3-0 PS1 18XBRD (SUTURE) IMPLANT
SUT VICRYL 4-0 PS2 18IN ABS (SUTURE) ×3 IMPLANT
SYR BULB 3OZ (MISCELLANEOUS) ×3 IMPLANT
SYR CONTROL 10ML LL (SYRINGE) IMPLANT
TOWEL OR 17X24 6PK STRL BLUE (TOWEL DISPOSABLE) ×6 IMPLANT
TUBE CONNECTING 20'X1/4 (TUBING) ×1
TUBE CONNECTING 20X1/4 (TUBING) ×1 IMPLANT
UNDERPAD 30X30 (UNDERPADS AND DIAPERS) ×3 IMPLANT

## 2016-10-01 NOTE — Anesthesia Procedure Notes (Addendum)
Anesthesia Regional Block:  Supraclavicular block  Pre-Anesthetic Checklist: ,, timeout performed, Correct Patient, Correct Site, Correct Laterality, Correct Procedure, Correct Position, site marked, Risks and benefits discussed,  Surgical consent,  Pre-op evaluation,  At surgeon's request and post-op pain management  Laterality: Right  Prep: chloraprep       Needles:   Needle Type: Echogenic Stimulator Needle     Needle Length: 9cm 9 cm Needle Gauge: 21 and 21 G    Additional Needles:  Procedures: ultrasound guided (picture in chart) and nerve stimulator Supraclavicular block  Nerve Stimulator or Paresthesia:  Response: 0.4 mA,   Additional Responses:   Narrative:  Start time: 10/01/2016 2:50 PM End time: 10/01/2016 3:00 PM Injection made incrementally with aspirations every 5 mL.  Performed by: Personally  Anesthesiologist: Arta BruceSSEY, Emrie Gayle  Additional Notes: Monitors applied. Patient sedated. Sterile prep and drape,hand hygiene and sterile gloves were used. Relevant anatomy identified.Needle position confirmed.Local anesthetic injected incrementally after negative aspiration. Local anesthetic spread visualized around nerve(s). Vascular puncture avoided. No complications. Image printed for medical record.The patient tolerated the procedure well.

## 2016-10-01 NOTE — Transfer of Care (Signed)
Immediate Anesthesia Transfer of Care Note  Patient: Debra Lewis M Fanguy  Procedure(s) Performed: Procedure(s): RIGHT CAPITELLUM OPEN REDUCTION INTERNAL FIXATION (Right)  Patient Location: PACU  Anesthesia Type:MAC  Level of Consciousness: awake and sedated  Airway & Oxygen Therapy: Patient Spontanous Breathing  Post-op Assessment: Report given to RN and Post -op Vital signs reviewed and stable  Post vital signs: Reviewed and stable  Last Vitals:  Vitals:   10/01/16 1530 10/01/16 1715  BP: 116/65   Pulse: 100 (!) 105  Resp: 16 17    Last Pain:  Vitals:   09/25/16 1656  PainSc: 9       Patients Stated Pain Goal: 3 (09/25/16 1656)  Complications: No apparent anesthesia complications

## 2016-10-01 NOTE — Brief Op Note (Signed)
10/01/2016  5:11 PM  PATIENT:  Rondell Reamsraci M Feimster  40 y.o. female  PRE-OPERATIVE DIAGNOSIS:  RIGHT CAPITELLUM FRACTURE POSSIBLE LIGAMENT INJURY  POST-OPERATIVE DIAGNOSIS:  RIGHT CAPITELLUM FRACTURE   PROCEDURE:  Procedure(s): RIGHT CAPITELLUM OPEN REDUCTION INTERNAL FIXATION (Right)  SURGEON:  Surgeon(s) and Role:    * Betha LoaKevin Samhita Kretsch, MD - Primary    * Cindee SaltGary Daianna Vasques, MD  PHYSICIAN ASSISTANT:   ASSISTANTS: Cindee SaltGary Meleane Selinger, MD   ANESTHESIA:   regional  EBL:  Total I/O In: 1000 [I.V.:1000] Out: 5 [Blood:5]  BLOOD ADMINISTERED:none  DRAINS: none   LOCAL MEDICATIONS USED:  NONE  SPECIMEN:  No Specimen  DISPOSITION OF SPECIMEN:  N/A  COUNTS:  YES  TOURNIQUET:   Total Tourniquet Time Documented: Upper Arm (Right) - 66 minutes Total: Upper Arm (Right) - 66 minutes   DICTATION: .Other Dictation: Dictation Number [id number]  PLAN OF CARE: Discharge to home after PACU  PATIENT DISPOSITION:  PACU - hemodynamically stable.

## 2016-10-01 NOTE — Progress Notes (Signed)
Assisted Dr. Ossey with right, ultrasound guided, interscalene  block. Side rails up, monitors on throughout procedure. See vital signs in flow sheet. Tolerated Procedure well. 

## 2016-10-01 NOTE — H&P (Signed)
Debra Lewis is an 40 y.o. female.   Chief Complaint: right capitellum fracture HPI: 40 yo female states she fell injuring right elbow.  Seen at ED where XR revealed capitellum fracture.  Splinted and followed up.  She wishes to have ORIF of fracture and possible ligament repair as necessary.  Allergies:  Allergies  Allergen Reactions  . Metrogel [Metronidazole]   . Penicillins Nausea Only    Sick on stomach  . Codeine Rash    Hives and itching  . Flagyl [Metronidazole Hcl] Nausea And Vomiting, Swelling and Rash  . Sulfonamide Derivatives Swelling and Rash    Past Medical History:  Diagnosis Date  . Abnormal pap   . ADHD (attention deficit hyperactivity disorder)   . Anxiety   . Asthma   . Bacterial vaginosis   . Chronic constipation   . Depression   . GERD (gastroesophageal reflux disease)   . History of frequent urinary tract infections   . HSV-2 (herpes simplex virus 2) infection   . Interstitial cystitis   . Migraines   . OCD (obsessive compulsive disorder)   . Pityriasis rosea     Past Surgical History:  Procedure Laterality Date  . conization of cervix    . ESOPHAGEAL DILATION      Family History: Family History  Problem Relation Age of Onset  . Breast cancer Maternal Aunt   . Cancer    . Heart disease    . Diabetes    . Lung disease    . Lung cancer Sister   . Depression Mother   . Drug abuse Father     Social History:   reports that she has never smoked. She has never used smokeless tobacco. She reports that she drinks alcohol. She reports that she does not use drugs.  Medications: Medications Prior to Admission  Medication Sig Dispense Refill  . albuterol (PROVENTIL HFA;VENTOLIN HFA) 108 (90 BASE) MCG/ACT inhaler Inhale 2 puffs into the lungs every 6 (six) hours as needed for wheezing. Reported on 01/17/2016    . ALPRAZolam (XANAX) 1 MG tablet Take 1 tablet (1 mg total) by mouth 3 (three) times daily as needed for sleep or anxiety. 90 tablet 2   . clindamycin (CLEOCIN) 300 MG capsule Take 300 mg by mouth 3 (three) times daily.    . fluticasone (FLONASE) 50 MCG/ACT nasal spray Place 2 sprays into the nose daily.    . hydrOXYzine (ATARAX/VISTARIL) 25 MG tablet Take 25 mg by mouth daily as needed for itching. Reported on 05/09/2016    . levonorgestrel-ethinyl estradiol (PORTIA-28) 0.15-30 MG-MCG tablet TAKE (1) TABLET BY MOUTH ONCE DAILY AS DIRECTED. 28 tablet 11  . moxifloxacin (VIGAMOX) 0.5 % ophthalmic solution Place 1 drop into both eyes 3 (three) times daily.    Marland Kitchen. omeprazole (PRILOSEC) 20 MG capsule TAKE (1) CAPSULE BY MOUTH EVERY DAY. 30 capsule 11  . tiZANidine (ZANAFLEX) 2 MG tablet Take 4 mg by mouth every 6 (six) hours as needed for muscle spasms.    Marland Kitchen. URELLE (URELLE/URISED) 81 MG TABS tablet TAKE 1 TABLET BY MOUTH FOUR TIMES DAILY. 120 tablet 3  . amphetamine-dextroamphetamine (ADDERALL) 30 MG tablet Take 30 mg by mouth daily. 30mg  in morning and 20 mg at night    . cetirizine (ZYRTEC) 10 MG tablet Take 10 mg by mouth daily.    . fluconazole (DIFLUCAN) 150 MG tablet USE AS DIRECTED. 2 tablet PRN  . ibuprofen (ADVIL,MOTRIN) 800 MG tablet Take 800 mg by mouth daily as  needed for pain (Associated with nerve damage).    . promethazine (PHENERGAN) 25 MG tablet Take 1 tablet (25 mg total) by mouth every 6 (six) hours as needed for nausea or vomiting. 15 tablet 0  . traMADol (ULTRAM) 50 MG tablet Take 1 tablet (50 mg total) by mouth every 6 (six) hours as needed. 20 tablet 0  . triamcinolone cream (KENALOG) 0.5 % Apply 1 application topically daily as needed (FOR IRRITATION).       No results found for this or any previous visit (from the past 48 hour(s)).  No results found.   A comprehensive review of systems was negative.  Height 4\' 11"  (1.499 m), weight 74.8 kg (165 lb), last menstrual period 07/18/2016.  General appearance: alert, cooperative and appears stated age Head: Normocephalic, without obvious abnormality,  atraumatic Neck: supple, symmetrical, trachea midline Resp: clear to auscultation bilaterally Cardio: regular rate and rhythm GI: non-tender Extremities: Intact sensation and capillary refill all digits.  +epl/fpl/io.  No wounds.  Pulses: 2+ and symmetric Skin: Skin color, texture, turgor normal. No rashes or lesions Neurologic: Grossly normal Incision/Wound:none  Assessment/Plan Right capitellum fracture. Non operative and operative treatment options were discussed with the patient and patient wishes to proceed with operative treatment. Risks, benefits, and alternatives of surgery were discussed and the patient agrees with the plan of care.   Neven Fina R 10/01/2016, 1:49 PM \

## 2016-10-01 NOTE — Op Note (Signed)
I assisted Surgeon(s) and Role:    * Betha LoaKevin Elbert Polyakov, MD - Primary    * Cindee SaltGary Lorain Keast, MD on the Procedure(s): RIGHT CAPITELLUM OPEN REDUCTION INTERNAL FIXATION on 10/01/2016.  I provided assistance on this case as follows: Approach, retraction, reduction of the fracture after debridement and placement of guide pins , insertion of internal fixation, interpretation of x-rays, closure of the wound, application of dressing and splint. I was present for the entire case. Electronically signed by: Nicki ReaperKUZMA,Kaidynce Pfister R, MD Date: 10/01/2016 Time: 5:12 PM

## 2016-10-01 NOTE — Anesthesia Preprocedure Evaluation (Signed)
Anesthesia Evaluation  Patient identified by MRN, date of birth, ID band Patient awake    Reviewed: Allergy & Precautions, NPO status , Patient's Chart, lab work & pertinent test results  Airway Mallampati: I  TM Distance: >3 FB Neck ROM: Full    Dental   Pulmonary asthma ,    Pulmonary exam normal        Cardiovascular Normal cardiovascular exam     Neuro/Psych    GI/Hepatic GERD  Medicated and Controlled,  Endo/Other    Renal/GU      Musculoskeletal   Abdominal   Peds  Hematology   Anesthesia Other Findings   Reproductive/Obstetrics                             Anesthesia Physical Anesthesia Plan  ASA: II  Anesthesia Plan: Regional and MAC   Post-op Pain Management:    Induction: Intravenous  Airway Management Planned: Simple Face Mask  Additional Equipment:   Intra-op Plan:   Post-operative Plan:   Informed Consent: I have reviewed the patients History and Physical, chart, labs and discussed the procedure including the risks, benefits and alternatives for the proposed anesthesia with the patient or authorized representative who has indicated his/her understanding and acceptance.     Plan Discussed with: CRNA and Surgeon  Anesthesia Plan Comments:         Anesthesia Quick Evaluation

## 2016-10-01 NOTE — Discharge Instructions (Signed)
Regional Anesthesia Blocks ° °1. Numbness or the inability to move the "blocked" extremity may last from 3-48 hours after placement. The length of time depends on the medication injected and your individual response to the medication. If the numbness is not going away after 48 hours, call your surgeon. ° °2. The extremity that is blocked will need to be protected until the numbness is gone and the  Strength has returned. Because you cannot feel it, you will need to take extra care to avoid injury. Because it may be weak, you may have difficulty moving it or using it. You may not know what position it is in without looking at it while the block is in effect. ° °3. For blocks in the legs and feet, returning to weight bearing and walking needs to be done carefully. You will need to wait until the numbness is entirely gone and the strength has returned. You should be able to move your leg and foot normally before you try and bear weight or walk. You will need someone to be with you when you first try to ensure you do not fall and possibly risk injury. ° °4. Bruising and tenderness at the needle site are common side effects and will resolve in a few days. ° °5. Persistent numbness or new problems with movement should be communicated to the surgeon or the [facility] ([phone])/ [facility] ([phone]). ° ° °Post Anesthesia Home Care Instructions ° °Activity: °Get plenty of rest for the remainder of the day. A responsible adult should stay with you for 24 hours following the procedure.  °For the next 24 hours, DO NOT: °-Drive a car °-Operate machinery °-Drink alcoholic beverages °-Take any medication unless instructed by your physician °-Make any legal decisions or sign important papers. ° °Meals: °Start with liquid foods such as gelatin or soup. Progress to regular foods as tolerated. Avoid greasy, spicy, heavy foods. If nausea and/or vomiting occur, drink only clear liquids until the  nausea and/or vomiting subsides. Call your physician if vomiting continues. ° °Special Instructions/Symptoms: °Your throat may feel dry or sore from the anesthesia or the breathing tube placed in your throat during surgery. If this causes discomfort, gargle with warm salt water. The discomfort should disappear within 24 hours. ° °If you had a scopolamine patch placed behind your ear for the management of post- operative nausea and/or vomiting: ° °1. The medication in the patch is effective for 72 hours, after which it should be removed.  Wrap patch in a tissue and discard in the trash. Wash hands thoroughly with soap and water. °2. You may remove the patch earlier than 72 hours if you experience unpleasant side effects which may include dry mouth, dizziness or visual disturbances. °3. Avoid touching the patch. Wash your hands with soap and water after contact with the patch. °  °[facility] Center Instructions °Hand Surgery ° °Wound Care: °Keep your hand elevated above the level of your heart.  Do not allow it to dangle by your side.  Keep the dressing dry and do not remove it unless your doctor advises you to do so.  He will usually change it at the time of your post-op visit.  Moving your fingers is advised to stimulate circulation but will depend on the site of your surgery.  If you have a splint applied, your doctor will advise you regarding movement. ° °Activity: °Do not drive or operate machinery today.  Rest today and then you may return   to your normal activity and work as indicated by your physician. ° °Diet:  °Drink liquids today or eat a light diet.  You may resume a regular diet tomorrow.   ° °General expectations: °Pain for two to three days. °Fingers may become slightly swollen. ° °Call your doctor if any of the following occur: °Severe pain not relieved by pain medication. °Elevated temperature. °Dressing soaked with blood. °Inability to move fingers. °White or bluish color to fingers. °

## 2016-10-01 NOTE — Anesthesia Postprocedure Evaluation (Signed)
Anesthesia Post Note  Patient: Debra Lewis  Procedure(s) Performed: Procedure(s) (LRB): RIGHT CAPITELLUM OPEN REDUCTION INTERNAL FIXATION (Right)  Patient location during evaluation: PACU Anesthesia Type: Regional Level of consciousness: awake and alert and patient cooperative Pain management: pain level controlled Vital Signs Assessment: post-procedure vital signs reviewed and stable Respiratory status: spontaneous breathing and respiratory function stable Cardiovascular status: stable Anesthetic complications: no    Last Vitals:  Vitals:   10/01/16 1530 10/01/16 1715  BP: 116/65 (!) 129/92  Pulse: 100 (!) 105  Resp: 16 17  Temp:  36.6 C    Last Pain:  Vitals:   09/25/16 1656  PainSc: 9                  [place] Delauder DAVID

## 2016-10-02 NOTE — Op Note (Signed)
NAMEstrella Deeds:  Jessee, Mittie              ACCOUNT NO.:  [id number]  MEDICAL RECORD NO.:  [id number]  LOCATION:                                 FACILITY:  PHYSICIAN:  Betha LoaKevin Cellie Dardis, MD        DATE OF BIRTH:  Jun 07, 1976  DATE OF PROCEDURE:  10/01/2016 DATE OF DISCHARGE:                              OPERATIVE REPORT   PREOPERATIVE DIAGNOSIS:  Right capitellum fracture with possible lateral collateral ligament injury.  POSTOPERATIVE DIAGNOSIS:  Right capitellum fracture.  PROCEDURE:  Open reduction and internal fixation of right capitellum fracture.  SURGEON:  Betha LoaKevin Jahmeer Porche, MD.  ASSISTANT:  Cindee SaltGary Mattheo Swindle, MD.  ANESTHESIA:  Regional.  IV FLUIDS:  Per anesthesia flow sheet.  ESTIMATED BLOOD LOSS:  Minimal.  COMPLICATIONS:  None.  SPECIMENS:  None.  TOURNIQUET TIME:  66 minutes.  DISPOSITION:  Stable to PACU.  INDICATIONS:  Debra Lewis is a 40 year old female who fell approximately 2-1/2 weeks ago injuring her right elbow.  She was seen at the emergency department where radiographs were taken revealing a capitellar fracture.  She was splinted and followed up in the office. We discussed treatment options.  She wished to proceed with operative fixation.  Risks, benefits and alternatives of surgery were discussed including the risk of blood loss; infection; damage to nerves, vessels, tendons, ligaments, bone; failure of surgery; need for additional surgery; complications with wound healing; continued pain; nonunion; malunion; stiffness.  She voiced understanding of these risks and elected to proceed.  OPERATIVE COURSE:  After being identified preoperatively by myself, the patient and I agreed upon procedure and site of procedure.  Surgical site was marked.  The risks, benefits, and alternatives of surgery were reviewed and she wished to proceed.  Surgical consent had been signed. She was given IV Ancef as preoperative antibiotic prophylaxis.  She was transferred to the operating room  and placed on the operating room table in supine position with the right upper extremity on arm board.  A regional block had been performed by Anesthesia in preoperative holding. Right upper extremity was prepped and draped in normal sterile orthopedic fashion.  A surgical pause was performed between surgeons, anesthesia, and operating room staff and all were in agreement as to the patient, procedure, and site of procedure.  Tourniquet at the proximal aspect of the extremity was inflated to 250 mmHg after exsanguination of the limb with an Esmarch bandage.  An incision was made at the lateral side of the elbow and carried into subcutaneous tissues by spreading technique.  Bipolar electrocautery was used to obtain hemostasis.  The lateral extensor mass was incised anterior to the lateral epicondyle. This was carried by spreading technique.  The fracture was identified. The muscular origin was released from the humerus anterior and proximal to the collateral ligament to aid in visualization.  The capitellar fracture was identified.  It was reduced under direct visualization. There was some damage to the radial head but not significant.  There was comminution both medially and laterally at the fracture site.  The C-arm was used in AP and lateral projections to ensure appropriate reduction. Two guide pins from the Acutrak screw set were then placed and Acutrak  screws placed with standard technique after the opening broach was used. Radiographs were then taken in AP and lateral projections to ensure appropriate reduction and position of the hardware, which was the case. There was a comminuted fragment at the medial side of the capitellum which kept coming loose.  It was too small for any type of fixation and was thus removed.  During repeat radiographs, the fracture displaced. The screws were removed.  The fracture was re-reduced and screws replaced.  A 2-mm longer screw was used in 1 of the  holes.  This provided acceptable fixation.  The wound was then copiously irrigated with sterile saline.  C-arm was used in AP and lateral projections to ensure appropriate reduction and position of hardware which was the case.  The lateral collateral ligament was stable to testing during the case.  The muscular origin was then repaired with a 2-0 Vicryl suture in a running fashion.  Subcutaneous tissues were closed with 3-0 Vicryl in an inverted interrupted fashion.  The skin was closed with a running subcuticular 4-0 Monocryl suture.  This was augmented with benzoin and Steri-Strips.  The wound was dressed with sterile 4x4s and wrapped with a Kerlix bandage.  A posterior splint with a side bar was placed with the elbow at 90 degrees.  This was wrapped with Ace bandage.  Tourniquet was deflated at 66 minutes.  Fingertips were pink with brisk capillary refill after deflation of the tourniquet.  The operative drapes were broken down and the patient was awoken from anesthesia safely.  She was transferred back to stretcher and taken to PACU in stable condition.  I will see her back in the office in 1 week for postoperative followup.  I will give her Percocet 5/325 one to two p.o. q.6 hours p.r.n. pain, dispensed #30.  She has taken this in the past.  We will also provide a prescription for Zofran 4 mg p.o. q.8 hours p.r.n. nausea, dispensed #20.  We will anticipate keeping the elbow at 90 degrees for 3-4 weeks due to the propensity of the fracture to displace with extension of the elbow.     Betha LoaKevin Cricket Goodlin, MD     KK/MEDQ  D:  10/01/2016  T:  10/02/2016  Job:  [id number]

## 2016-10-03 ENCOUNTER — Encounter (HOSPITAL_BASED_OUTPATIENT_CLINIC_OR_DEPARTMENT_OTHER): Payer: Self-pay | Admitting: Orthopedic Surgery

## 2016-11-12 ENCOUNTER — Ambulatory Visit: Payer: Self-pay | Admitting: Orthopedic Surgery

## 2016-12-04 ENCOUNTER — Other Ambulatory Visit (HOSPITAL_COMMUNITY): Payer: Self-pay | Admitting: Orthopedic Surgery

## 2016-12-04 DIAGNOSIS — M25511 Pain in right shoulder: Secondary | ICD-10-CM

## 2016-12-12 ENCOUNTER — Ambulatory Visit (HOSPITAL_COMMUNITY): Payer: Medicaid Other

## 2016-12-13 ENCOUNTER — Ambulatory Visit (HOSPITAL_COMMUNITY): Payer: Medicaid Other

## 2016-12-13 ENCOUNTER — Ambulatory Visit (HOSPITAL_COMMUNITY)
Admission: RE | Admit: 2016-12-13 | Discharge: 2016-12-13 | Disposition: A | Discharge status: Home / Self Care | Payer: Medicaid Other | Source: Ambulatory Visit | Attending: Orthopedic Surgery | Admitting: Orthopedic Surgery

## 2016-12-13 DIAGNOSIS — S46011A Strain of muscle(s) and tendon(s) of the rotator cuff of right shoulder, initial encounter: Secondary | ICD-10-CM | POA: Diagnosis present

## 2016-12-13 DIAGNOSIS — X58XXXA Exposure to other specified factors, initial encounter: Secondary | ICD-10-CM | POA: Diagnosis not present

## 2016-12-13 DIAGNOSIS — M25511 Pain in right shoulder: Secondary | ICD-10-CM

## 2016-12-13 DIAGNOSIS — M75101 Unspecified rotator cuff tear or rupture of right shoulder, not specified as traumatic: Secondary | ICD-10-CM | POA: Insufficient documentation

## 2017-02-12 ENCOUNTER — Other Ambulatory Visit: Payer: Self-pay | Admitting: Obstetrics & Gynecology

## 2017-02-21 ENCOUNTER — Ambulatory Visit: Payer: Medicaid Other | Admitting: Obstetrics & Gynecology

## 2017-03-04 ENCOUNTER — Ambulatory Visit: Payer: Medicaid Other | Admitting: Obstetrics & Gynecology

## 2017-04-04 ENCOUNTER — Other Ambulatory Visit: Payer: Self-pay | Admitting: Obstetrics & Gynecology

## 2017-04-25 ENCOUNTER — Encounter: Payer: Self-pay | Admitting: Obstetrics & Gynecology

## 2017-04-25 ENCOUNTER — Ambulatory Visit (INDEPENDENT_AMBULATORY_CARE_PROVIDER_SITE_OTHER): Payer: Medicaid Other | Admitting: Obstetrics & Gynecology

## 2017-04-25 ENCOUNTER — Other Ambulatory Visit: Payer: Self-pay | Admitting: Obstetrics & Gynecology

## 2017-04-25 VITALS — BP 120/82 | HR 119 | Wt 178.0 lb

## 2017-04-25 DIAGNOSIS — N3010 Interstitial cystitis (chronic) without hematuria: Secondary | ICD-10-CM | POA: Diagnosis not present

## 2017-04-25 DIAGNOSIS — B9689 Other specified bacterial agents as the cause of diseases classified elsewhere: Secondary | ICD-10-CM

## 2017-04-25 DIAGNOSIS — N760 Acute vaginitis: Secondary | ICD-10-CM

## 2017-04-25 DIAGNOSIS — Z3202 Encounter for pregnancy test, result negative: Secondary | ICD-10-CM | POA: Diagnosis not present

## 2017-04-25 LAB — POCT URINE PREGNANCY: PREG TEST UR: NEGATIVE

## 2017-04-25 MED ORDER — METRONIDAZOLE 0.75 % VA GEL: VAGINAL | 0 refills | Status: DC

## 2017-04-25 NOTE — Addendum Note (Signed)
Addended by: Lazaro ArmsEURE, LUTHER H on: 04/25/2017 12:32 PM   Modules accepted: Orders

## 2017-04-25 NOTE — Progress Notes (Signed)
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   The urethral meatus was prepped with Betadine DMSO 50 cc was instilled in the usual fashion after the bladder had been catheterized and completely emptied All 50 cc was instilled into the bladder without difficulty and the patient tolerated it well The patient knows to refrain from voiding as long as possible  In addition [name] was complaining of a vaginal discharge she has recurrent yeast infections She is allergic to metronidazole oral but we will do vaginal MetroGel which she can take   Follow up in 12 weeks, or as patient requests based on her symptom complex     Face to face time:  15 minutes  Greater than 50% of the visit time was spent in counseling and coordination of care with the patient.  The summary and outline of the counseling and care coordination is summarized in the note above.   All questions were answered.

## 2017-06-13 ENCOUNTER — Encounter (HOSPITAL_COMMUNITY): Payer: Self-pay | Admitting: Emergency Medicine

## 2017-06-13 ENCOUNTER — Emergency Department (HOSPITAL_COMMUNITY)
Admission: EM | Admit: 2017-06-13 | Discharge: 2017-06-13 | Disposition: A | Discharge status: Home / Self Care | Payer: Medicaid Other | Attending: Emergency Medicine | Admitting: Emergency Medicine

## 2017-06-13 DIAGNOSIS — B350 Tinea barbae and tinea capitis: Secondary | ICD-10-CM | POA: Insufficient documentation

## 2017-06-13 DIAGNOSIS — R21 Rash and other nonspecific skin eruption: Secondary | ICD-10-CM | POA: Diagnosis present

## 2017-06-13 DIAGNOSIS — Z23 Encounter for immunization: Secondary | ICD-10-CM | POA: Insufficient documentation

## 2017-06-13 DIAGNOSIS — J45909 Unspecified asthma, uncomplicated: Secondary | ICD-10-CM | POA: Insufficient documentation

## 2017-06-13 DIAGNOSIS — Z79899 Other long term (current) drug therapy: Secondary | ICD-10-CM | POA: Diagnosis not present

## 2017-06-13 LAB — COMPREHENSIVE METABOLIC PANEL
ALT: 23 U/L (ref 14–54)
AST: 27 U/L (ref 15–41)
Albumin: 3.4 g/dL — ABNORMAL LOW (ref 3.5–5.0)
Alkaline Phosphatase: 67 U/L (ref 38–126)
Anion gap: 6 (ref 5–15)
BUN: 6 mg/dL (ref 6–20)
CHLORIDE: 111 mmol/L (ref 101–111)
CO2: 27 mmol/L (ref 22–32)
Calcium: 9.8 mg/dL (ref 8.9–10.3)
Creatinine, Ser: 0.87 mg/dL (ref 0.44–1.00)
GFR calc Af Amer: >60 mL/min (ref >60)
Glucose, Bld: 111 mg/dL — ABNORMAL HIGH (ref 65–99)
POTASSIUM: 3.9 mmol/L (ref 3.5–5.1)
SODIUM: 144 mmol/L (ref 135–145)
Total Bilirubin: 0.2 mg/dL — ABNORMAL LOW (ref 0.3–1.2)
Total Protein: 7.4 g/dL (ref 6.5–8.1)

## 2017-06-13 LAB — CBC WITH DIFFERENTIAL/PLATELET
BASOS PCT: 0 %
Basophils Absolute: 0 10*3/uL (ref 0.0–0.1)
EOS PCT: 5 %
Eosinophils Absolute: 0.5 10*3/uL (ref 0.0–0.7)
HEMATOCRIT: 37 % (ref 36.0–46.0)
Hemoglobin: 12.2 g/dL (ref 12.0–15.0)
LYMPHS PCT: 37 %
Lymphs Abs: 4 10*3/uL (ref 0.7–4.0)
MCH: 26.5 pg (ref 26.0–34.0)
MCHC: 33 g/dL (ref 30.0–36.0)
MCV: 80.4 fL (ref 78.0–100.0)
MONO ABS: 0.6 10*3/uL (ref 0.1–1.0)
MONOS PCT: 5 %
NEUTROS ABS: 5.8 10*3/uL (ref 1.7–7.7)
Neutrophils Relative %: 53 %
PLATELETS: 512 10*3/uL — AB (ref 150–400)
RBC: 4.6 MIL/uL (ref 3.87–5.11)
RDW: 15.6 % — AB (ref 11.5–15.5)
WBC: 11 10*3/uL — ABNORMAL HIGH (ref 4.0–10.5)

## 2017-06-13 LAB — I-STAT BETA HCG BLOOD, ED (MC, WL, AP ONLY): I-stat hCG, quantitative: <5 m[IU]/mL (ref <5)

## 2017-06-13 MED ORDER — DOXYCYCLINE HYCLATE 100 MG PO TABS: 100.0000 mg | ORAL_TABLET | Freq: Two times a day (BID) | ORAL | 0 refills | Status: DC

## 2017-06-13 MED ORDER — GRISEOFULVIN MICROSIZE 500 MG PO TABS: 500.0000 mg | ORAL_TABLET | Freq: Every day | ORAL | 0 refills | Status: DC

## 2017-06-13 MED ORDER — TETANUS-DIPHTH-ACELL PERTUSSIS 5-2.5-18.5 LF-MCG/0.5 IM SUSP
0.5000 mL | Freq: Once | INTRAMUSCULAR | Status: AC
  Administered 2017-06-13: 0.5 mL via INTRAMUSCULAR
  Filled 2017-06-13: qty 0.5

## 2017-06-13 NOTE — Discharge Instructions (Signed)
Take the prescriptions as directed.  Your scalp infection may be a kerion. This will need 4 to 6 weeks of antifungal treatment. You will need your liver functions re-checked during the course of this treatment. Call your regular medical doctor on Monday to schedule a follow up appointment next week.  Return to the Emergency Department immediately sooner if worsening.

## 2017-06-13 NOTE — ED Provider Notes (Signed)
[facility] DEPT Provider Note   CSN: [id number] Arrival date & time: 06/13/17  1952     History   Chief Complaint Chief Complaint  Patient presents with  . Wound Check    HPI Debra Lewis is a 41 y.o. female.  HPI Pt was seen at 2015.  Per pt, c/o unknown onset and persistence of constant "rash" to top of her head that she noticed today. Pt states she had her hair done up in 2 pigtails with false hair wrapped around them for the past 2 weeks. States there were only a few bobby pins, but no glue, hair combs, etc. Pt states she took her hair down today "because it smelled" and that is when she noticed the rash. States 1 week ago she "felt feverish with chills" but those symptoms have since resolved. Denies any other area of rash. Denies CP/SOB, no abd pain, no N/V/D, no known injury to scalp.    Td unk Past Medical History:  Diagnosis Date  . Abnormal pap   . ADHD (attention deficit hyperactivity disorder)   . Anxiety   . Asthma   . Bacterial vaginosis   . Chronic constipation   . Depression   . GERD (gastroesophageal reflux disease)   . History of frequent urinary tract infections   . HSV-2 (herpes simplex virus 2) infection   . Interstitial cystitis   . Migraines   . OCD (obsessive compulsive disorder)   . Pityriasis rosea     Patient Active Problem List   Diagnosis Date Noted  . Urinary tract infection, site not specified 04/16/2013  . Chronic interstitial cystitis 03/03/2013  . 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 WRIST PAIN, BILATERAL 12/26/2009  . KNEE SPRAIN 12/26/2009    Past Surgical History:  Procedure Laterality Date  . conization of cervix    . ESOPHAGEAL DILATION    . ORIF ELBOW FRACTURE Right 10/01/2016   Procedure: RIGHT CAPITELLUM OPEN REDUCTION INTERNAL FIXATION;  Surgeon: Betha Loa, MD;  Location: [facility];  Service: Orthopedics;  Laterality: Right;    OB History    Gravida Para Term Preterm AB Living   1 1 1      1    SAB TAB Ectopic Multiple Live Births           1       Home Medications    Prior to Admission medications   Medication Sig Start Date End Date Taking? Authorizing Provider  albuterol (PROVENTIL HFA;VENTOLIN HFA) 108 (90 BASE) MCG/ACT inhaler Inhale 2 puffs into the lungs every 6 (six) hours as needed for wheezing. Reported on 01/17/2016    [provider]  ALPRAZolam Prudy Feeler) 1 MG tablet Take 1 tablet (1 mg total) by mouth 3 (three) times daily as needed for sleep or anxiety. 03/30/15   Myrlene Broker, MD  amphetamine-dextroamphetamine (ADDERALL) 30 MG tablet Take 30 mg by mouth daily. 30mg  in morning and 20 mg at night    [provider]  cetirizine (ZYRTEC) 10 MG tablet Take 10 mg by mouth daily.    [provider]  clindamycin (CLEOCIN) 300 MG capsule Take 300 mg by mouth 3 (three) times daily.    [provider]  fluconazole (DIFLUCAN) 150 MG tablet USE AS DIRECTED. 02/12/17   Lazaro Arms, MD  fluticasone (FLONASE) 50 MCG/ACT nasal spray Place 2 sprays into the nose daily.    [provider]  hydrOXYzine (ATARAX/VISTARIL) 25 MG tablet Take 25 mg by mouth daily as  needed for itching. Reported on 05/09/2016    [provider]  ibuprofen (ADVIL,MOTRIN) 800 MG tablet Take 800 mg by mouth daily as needed for pain (Associated with nerve damage).    [provider]  metroNIDAZOLE (METROGEL VAGINAL) 0.75 % vaginal gel Nightly x 5 nights 04/25/17   Lazaro Arms, MD  moxifloxacin (VIGAMOX) 0.5 % ophthalmic solution Place 1 drop into both eyes 3 (three) times daily.    [provider]  omeprazole (PRILOSEC) 20 MG capsule TAKE (1) CAPSULE BY MOUTH EVERY DAY. 04/06/17   Lazaro Arms, MD  PORTIA-28 0.15-30 MG-MCG tablet TAKE (1) TABLET BY MOUTH ONCE DAILY AS DIRECTED. 04/26/17   Lazaro Arms, MD  tiZANidine (ZANAFLEX) 2 MG tablet Take 4 mg by mouth every 6 (six) hours as needed for muscle spasms.    [provider]    triamcinolone cream (KENALOG) 0.5 % Apply 1 application topically daily as needed (FOR IRRITATION).     [provider]  URELLE (URELLE/URISED) 81 MG TABS tablet TAKE 1 TABLET BY MOUTH FOUR TIMES DAILY. 05/02/16   Lazaro Arms, MD    Family History Family History  Problem Relation Age of Onset  . Breast cancer Maternal Aunt   . Cancer Unknown   . Heart disease Unknown   . Diabetes Unknown   . Lung disease Unknown   . Lung cancer Sister   . Depression Mother   . Drug abuse Father     Social History Social History  Substance Use Topics  . Smoking status: Never Smoker  . Smokeless tobacco: Never Used  . Alcohol use Yes     Comment: socially     Allergies   Metrogel [metronidazole]; Penicillins; Codeine; Flagyl [metronidazole hcl]; and Sulfonamide derivatives   Review of Systems Review of Systems ROS: Statement: All systems negative except as marked or noted in the HPI; Constitutional: Negative for fever and chills. ; ; Eyes: Negative for eye pain, redness and discharge. ; ; ENMT: Negative for ear pain, hoarseness, nasal congestion, sinus pressure and sore throat. ; ; Cardiovascular: Negative for chest pain, palpitations, diaphoresis, dyspnea and peripheral edema. ; ; Respiratory: Negative for cough, wheezing and stridor. ; ; Gastrointestinal: Negative for nausea, vomiting, diarrhea, abdominal pain, blood in stool, hematemesis, jaundice and rectal bleeding. . ; ; Genitourinary: Negative for dysuria, flank pain and hematuria. ; ; Musculoskeletal: Negative for back pain and neck pain. Negative for swelling and trauma.; ; Skin: Negative for pruritus, abrasions, blisters, bruising and +skin lesion.; ; Neuro: Negative for headache, lightheadedness and neck stiffness. Negative for weakness, altered level of consciousness, altered mental status, extremity weakness, paresthesias, involuntary movement, seizure and syncope.      Physical Exam Updated Vital Signs BP (!) 147/83 (BP  Location: Left Arm)   Pulse 82   Temp 97.7 F (36.5 C) (Oral)   Resp 18   Ht 4\' 11"  (1.499 m)   Wt 83 kg (182 lb 14.4 oz)   SpO2 96%   BMI 36.94 kg/m   Physical Exam 2020: Physical examination:  Nursing notes reviewed; Vital signs and O2 SAT reviewed;  Constitutional: Well developed, Well nourished, Well hydrated, In no acute distress; Head:  Normocephalic, atraumatic. +2 patchy areas of alopecia, bogginess with crusting, pustules and drainage.; Eyes: EOMI, PERRL, No scleral icterus; ENMT: Mouth and pharynx normal, Mucous membranes moist; Neck: Supple, Full range of motion, No lymphadenopathy; Cardiovascular: Regular rate and rhythm, No gallop; Respiratory: Breath sounds clear & equal bilaterally, No wheezes.  Speaking full sentences with ease, Normal respiratory effort/excursion; Chest: Nontender, Movement normal; Abdomen: Soft, Nontender, Nondistended, Normal bowel sounds; Genitourinary: No CVA tenderness; Extremities: Pulses normal, No tenderness, No edema.; Neuro: AA&Ox3, Major CN grossly intact.  Speech clear. No gross focal motor or sensory deficits in extremities. Climbs on and off stretcher easily by herself. Gait steady.; Skin: Color normal, Warm, Dry.   ED Treatments / Results  Labs (all labs ordered are listed, but only abnormal results are displayed)   EKG  EKG Interpretation None       Radiology   Procedures Procedures (including critical care time)  Medications Ordered in ED Medications  Tdap (BOOSTRIX) injection 0.5 mL (0.5 mLs Intramuscular Given 06/13/17 2050)     Initial Impression / Assessment and Plan / ED Course  I have reviewed the triage vital signs and the nursing notes.  Pertinent labs & imaging results that were available during my care of the patient were reviewed by me and considered in my medical decision making (see chart for details).  MDM Reviewed: previous chart, nursing note and vitals Reviewed previous: labs Interpretation:  labs    Results for orders placed or performed during the hospital encounter of 06/13/17  Comprehensive metabolic panel  Result Value Ref Range   Sodium 144 135 - 145 mmol/L   Potassium 3.9 3.5 - 5.1 mmol/L   Chloride 111 101 - 111 mmol/L   CO2 27 22 - 32 mmol/L   Glucose, Bld 111 (H) 65 - 99 mg/dL   BUN 6 6 - 20 mg/dL   Creatinine, Ser 0.96 0.44 - 1.00 mg/dL   Calcium 9.8 8.9 - 04.5 mg/dL   Total Protein 7.4 6.5 - 8.1 g/dL   Albumin 3.4 (L) 3.5 - 5.0 g/dL   AST 27 15 - 41 U/L   ALT 23 14 - 54 U/L   Alkaline Phosphatase 67 38 - 126 U/L   Total Bilirubin 0.2 (L) 0.3 - 1.2 mg/dL   GFR calc non Af Amer >60 >60 mL/min   GFR calc Af Amer >60 >60 mL/min   Anion gap 6 5 - 15  CBC with Differential  Result Value Ref Range   WBC 11.0 (H) 4.0 - 10.5 K/uL   RBC 4.60 3.87 - 5.11 MIL/uL   Hemoglobin 12.2 12.0 - 15.0 g/dL   HCT 40.9 81.1 - 91.4 %   MCV 80.4 78.0 - 100.0 fL   MCH 26.5 26.0 - 34.0 pg   MCHC 33.0 30.0 - 36.0 g/dL   RDW 78.2 (H) 95.6 - 21.3 %   Platelets 512 (H) 150 - 400 K/uL   Neutrophils Relative % 53 %   Neutro Abs 5.8 1.7 - 7.7 K/uL   Lymphocytes Relative 37 %   Lymphs Abs 4.0 0.7 - 4.0 K/uL   Monocytes Relative 5 %   Monocytes Absolute 0.6 0.1 - 1.0 K/uL   Eosinophils Relative 5 %   Eosinophils Absolute 0.5 0.0 - 0.7 K/uL   Basophils Relative 0 %   Basophils Absolute 0.0 0.0 - 0.1 K/uL  I-Stat beta hCG blood, ED  Result Value Ref Range   I-stat hCG, quantitative <5.0 <5 mIU/mL   Comment 3            2140:  Skin rash appears to be kerion. LFT's normal. Will tx with griseofulvin and doxycycline for possibility of bacterial superinfection. Dx and testing d/w pt.  Questions answered.  Verb understanding, agreeable to d/c home with outpt f/u.  Final Clinical Impressions(s) / ED Diagnoses   Final diagnoses:  None    New Prescriptions New Prescriptions   No medications on file     Samuel Jester, DO 06/15/17 1357

## 2017-06-13 NOTE — ED Triage Notes (Signed)
Pt states she had her braids removed today and has wounds to the crown of head. Pt states a week ago she was having fever and chills but denies any now.

## 2017-06-20 ENCOUNTER — Other Ambulatory Visit: Payer: Medicaid Other | Admitting: Obstetrics & Gynecology

## 2017-07-26 IMAGING — CT CT WRIST*R* W/O CM
2 series · 17 of 29 positions shown, 20 images · non-contrast
Comparison: Radiographs 09/14/2016

CLINICAL DATA: Evaluate possible wrist fractures. Patient fell
09/14/2016 and injured right wrist.

EXAM:
CT OF THE RIGHT WRIST WITHOUT CONTRAST
TECHNIQUE: Multidetector CT imaging of the right wrist was performed according
to the standard protocol. Multiplanar CT image reconstructions were
also generated.

[Series 5: upper ext soft · axial · 0.26mm/px · z∈[-72,+32]mm · 11 of 50 slices shown, 14 images]
[im 4/50  soft-tissue]
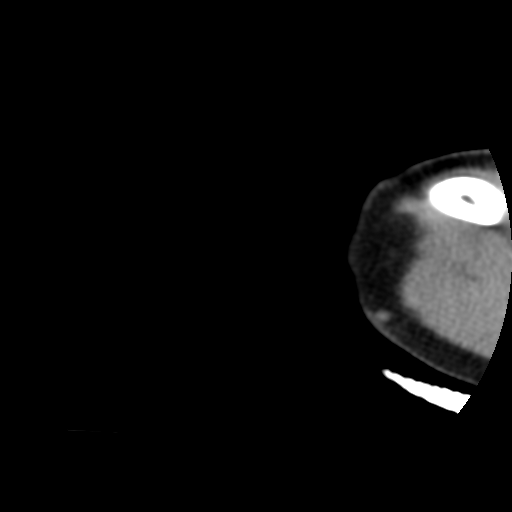
[im 4/50  bone]
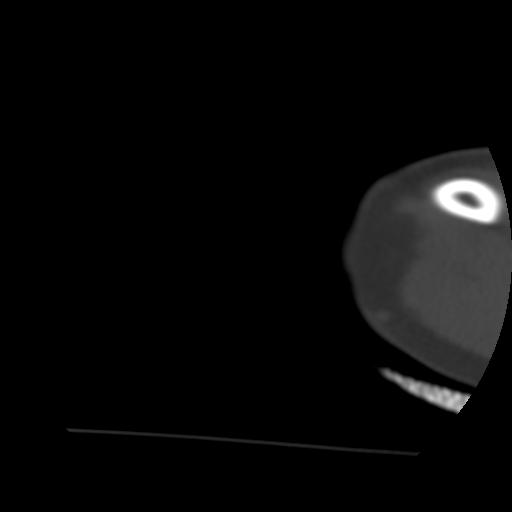
[im 8/50  bone]
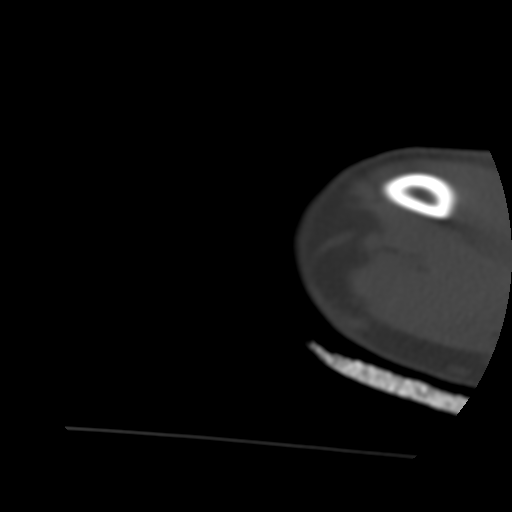
[im 12/50  bone]
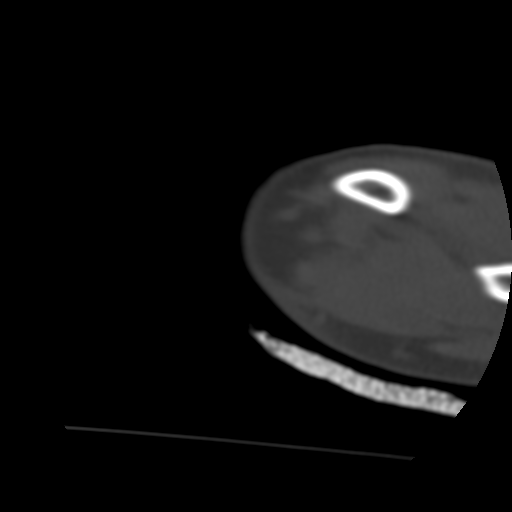
[im 16/50  bone]
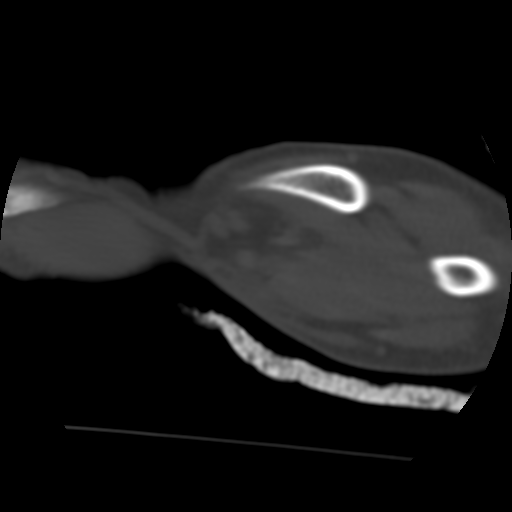
[im 19/50  soft-tissue]
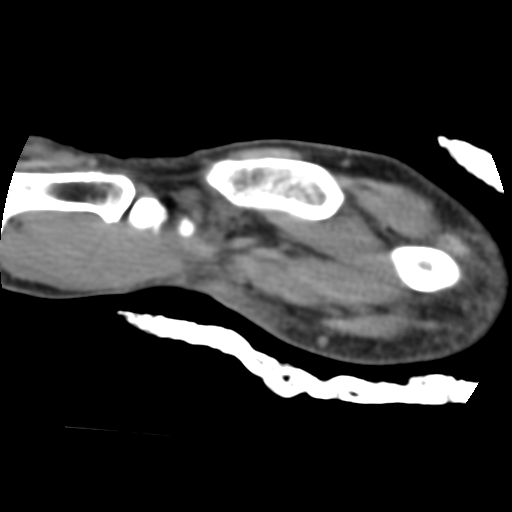
[im 19/50  bone]
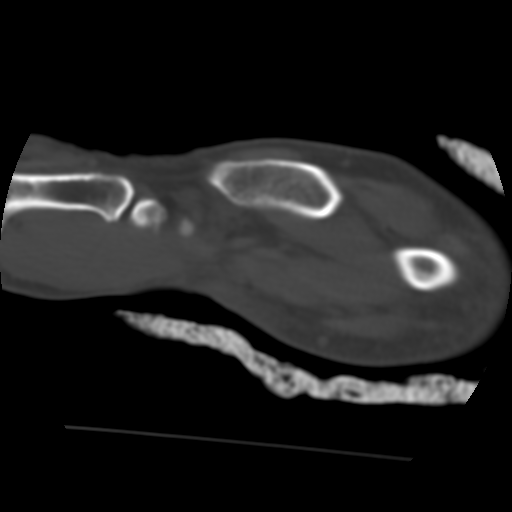
[im 27/50  bone]
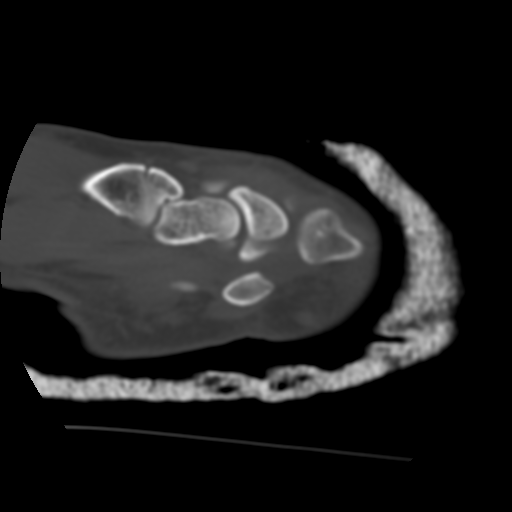
[im 31/50  bone]
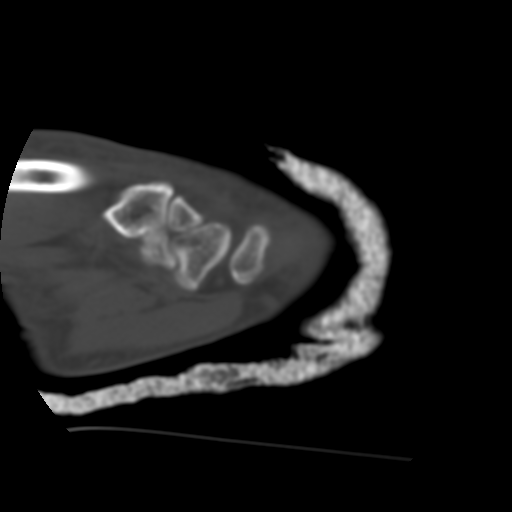
[im 34/50  bone]
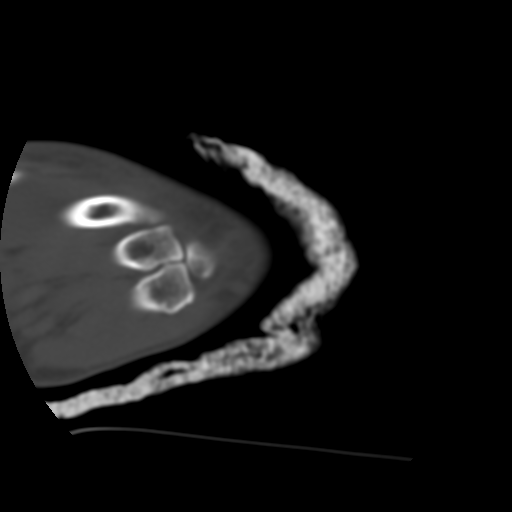
[im 38/50  soft-tissue]
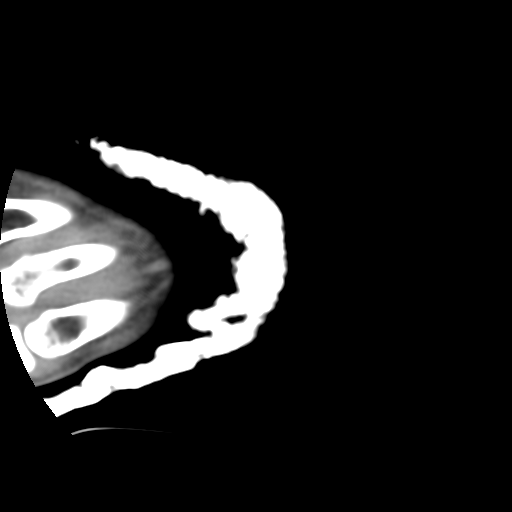
[im 38/50  bone]
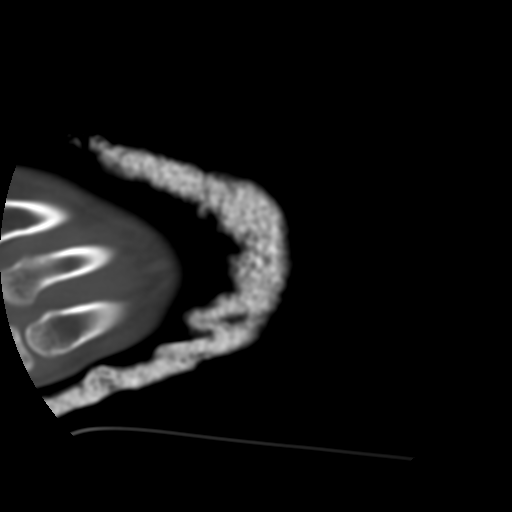
[im 42/50  bone]
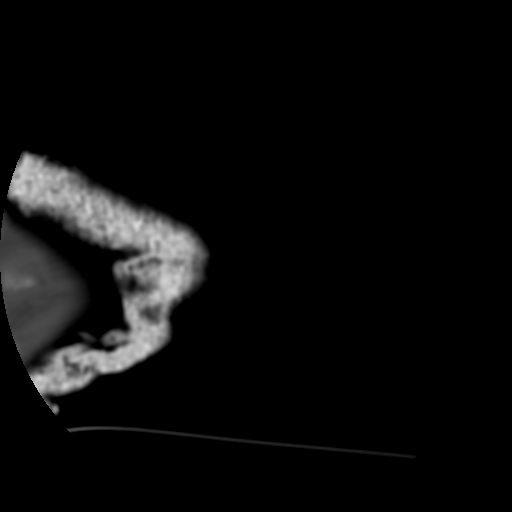
[im 46/50  bone]
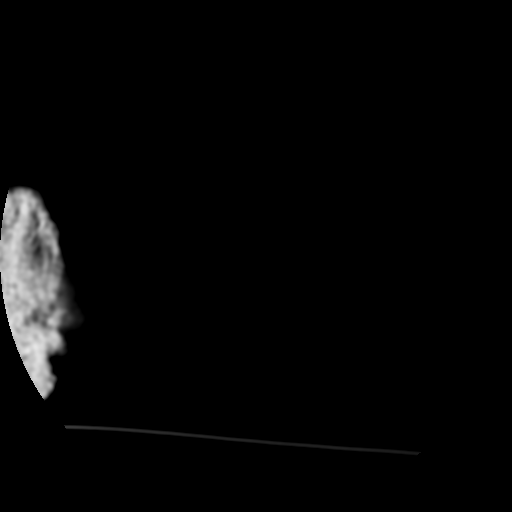

[Series 301: axial soft · sagittal · 0.26mm/px · 6 of 42 slices shown]
[im 14/42  bone]
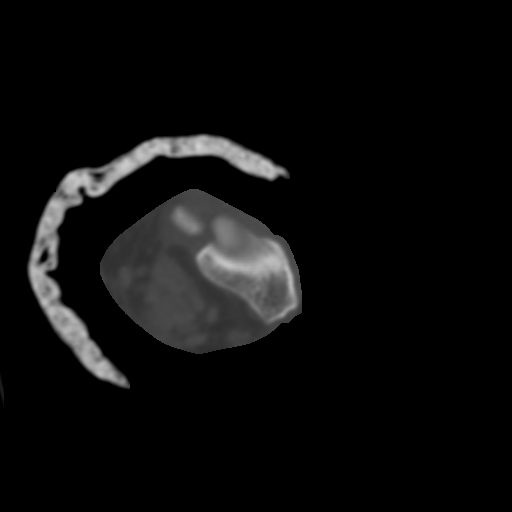
[im 18/42  bone]
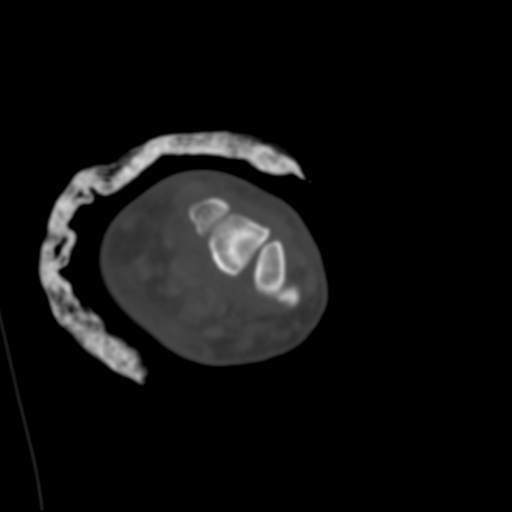
[im 21/42  bone]
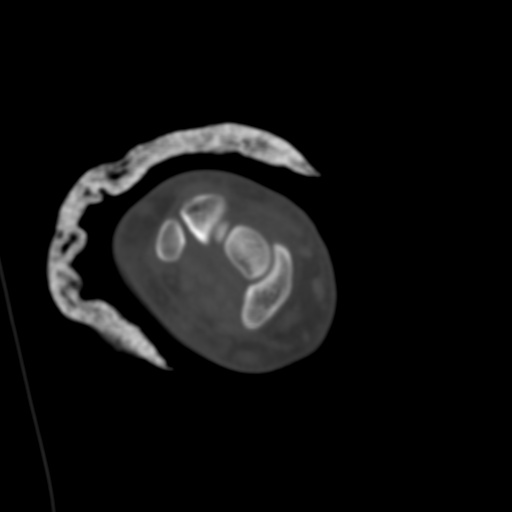
[im 22/42  soft-tissue]
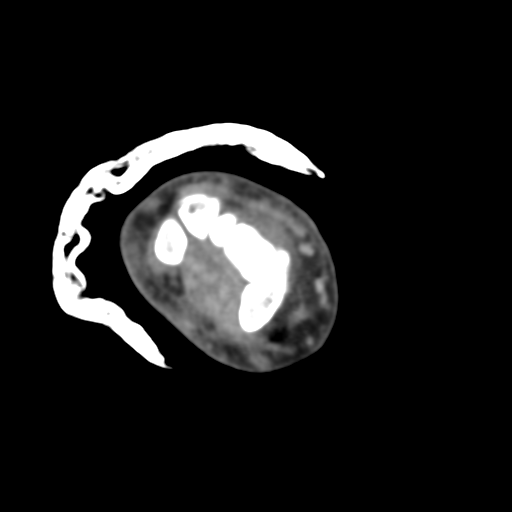
[im 24/42  bone]
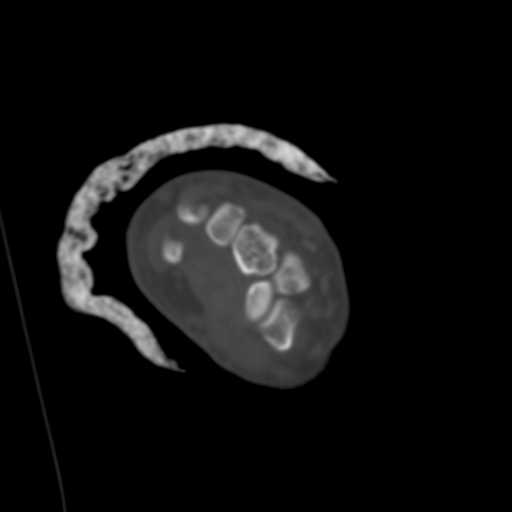
[im 28/42  bone]
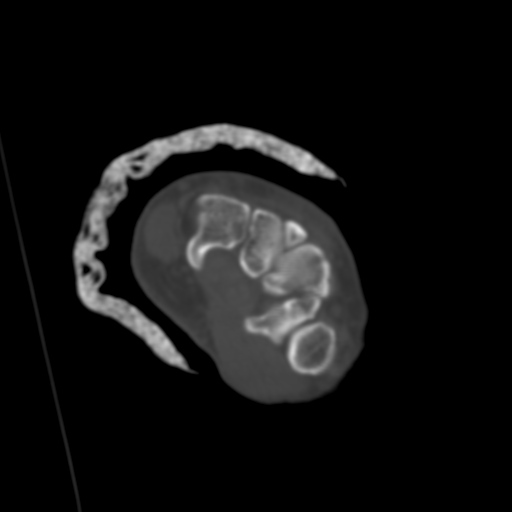

[17 of 29 positions shown; findings below may reference images not displayed]

FINDINGS: Bones/Joint/Cartilage

The intercarpal and radiocarpal joint spaces are maintained. Carpal
alignment is normal. No wrist fractures are identified. No
osteochondral abnormality. No significant soft tissue abnormalities.
IMPRESSION: No acute wrist or proximal hand fractures are identified.

## 2017-07-27 ENCOUNTER — Encounter (HOSPITAL_COMMUNITY): Payer: Self-pay | Admitting: *Deleted

## 2017-07-27 DIAGNOSIS — R079 Chest pain, unspecified: Secondary | ICD-10-CM | POA: Diagnosis present

## 2017-07-27 DIAGNOSIS — J45909 Unspecified asthma, uncomplicated: Secondary | ICD-10-CM | POA: Insufficient documentation

## 2017-07-27 DIAGNOSIS — Z79899 Other long term (current) drug therapy: Secondary | ICD-10-CM | POA: Insufficient documentation

## 2017-07-27 DIAGNOSIS — R0789 Other chest pain: Secondary | ICD-10-CM | POA: Insufficient documentation

## 2017-07-27 DIAGNOSIS — F419 Anxiety disorder, unspecified: Secondary | ICD-10-CM | POA: Insufficient documentation

## 2017-07-27 NOTE — ED Triage Notes (Signed)
Pt c/o left side chest pain that radiates down left arm and is c/o numbness; pt is tearful and states she may be having a panic attack

## 2017-07-28 ENCOUNTER — Emergency Department (HOSPITAL_COMMUNITY)
Admission: EM | Admit: 2017-07-28 | Discharge: 2017-07-28 | Disposition: A | Discharge status: Home / Self Care | Payer: Medicaid Other | Attending: Emergency Medicine | Admitting: Emergency Medicine

## 2017-07-28 ENCOUNTER — Emergency Department (HOSPITAL_COMMUNITY): Payer: Medicaid Other

## 2017-07-28 DIAGNOSIS — R0789 Other chest pain: Secondary | ICD-10-CM

## 2017-07-28 DIAGNOSIS — F419 Anxiety disorder, unspecified: Secondary | ICD-10-CM

## 2017-07-28 LAB — CBC WITH DIFFERENTIAL/PLATELET
BASOS PCT: 0 %
Basophils Absolute: 0 10*3/uL (ref 0.0–0.1)
EOS ABS: 0.2 10*3/uL (ref 0.0–0.7)
Eosinophils Relative: 2 %
HEMATOCRIT: 38.6 % (ref 36.0–46.0)
HEMOGLOBIN: 12.7 g/dL (ref 12.0–15.0)
LYMPHS ABS: 4.8 10*3/uL — AB (ref 0.7–4.0)
Lymphocytes Relative: 47 %
MCH: 26.3 pg (ref 26.0–34.0)
MCHC: 32.9 g/dL (ref 30.0–36.0)
MCV: 80.1 fL (ref 78.0–100.0)
MONO ABS: 0.6 10*3/uL (ref 0.1–1.0)
Monocytes Relative: 6 %
NEUTROS ABS: 4.6 10*3/uL (ref 1.7–7.7)
Neutrophils Relative %: 45 %
Platelets: 462 10*3/uL — ABNORMAL HIGH (ref 150–400)
RBC: 4.82 MIL/uL (ref 3.87–5.11)
RDW: 15.5 % (ref 11.5–15.5)
WBC: 10.2 10*3/uL (ref 4.0–10.5)

## 2017-07-28 LAB — URINALYSIS, ROUTINE W REFLEX MICROSCOPIC
BILIRUBIN URINE: NEGATIVE
GLUCOSE, UA: NEGATIVE mg/dL
HGB URINE DIPSTICK: NEGATIVE
Ketones, ur: NEGATIVE mg/dL
Leukocytes, UA: NEGATIVE
Nitrite: NEGATIVE
Protein, ur: NEGATIVE mg/dL
SPECIFIC GRAVITY, URINE: 1.024 (ref 1.005–1.030)
pH: 5 (ref 5.0–8.0)

## 2017-07-28 LAB — COMPREHENSIVE METABOLIC PANEL
ALBUMIN: 3.5 g/dL (ref 3.5–5.0)
ALK PHOS: 76 U/L (ref 38–126)
ALT: 43 U/L (ref 14–54)
AST: 46 U/L — ABNORMAL HIGH (ref 15–41)
Anion gap: 8 (ref 5–15)
BUN: 6 mg/dL (ref 6–20)
CALCIUM: 9.3 mg/dL (ref 8.9–10.3)
CHLORIDE: 109 mmol/L (ref 101–111)
CO2: 24 mmol/L (ref 22–32)
CREATININE: 0.9 mg/dL (ref 0.44–1.00)
GFR calc Af Amer: >60 mL/min (ref >60)
GFR calc non Af Amer: >60 mL/min (ref >60)
GLUCOSE: 118 mg/dL — AB (ref 65–99)
Potassium: 3.5 mmol/L (ref 3.5–5.1)
SODIUM: 141 mmol/L (ref 135–145)
Total Bilirubin: 0.6 mg/dL (ref 0.3–1.2)
Total Protein: 7.7 g/dL (ref 6.5–8.1)

## 2017-07-28 LAB — TROPONIN I: Troponin I: <0.03 ng/mL (ref <0.03)

## 2017-07-28 LAB — D-DIMER, QUANTITATIVE (NOT AT ARMC): D DIMER QUANT: 1.33 ug{FEU}/mL — AB (ref 0.00–0.50)

## 2017-07-28 LAB — LIPASE, BLOOD: Lipase: 28 U/L (ref 11–51)

## 2017-07-28 MED ORDER — SODIUM CHLORIDE 0.9 % IV BOLUS (SEPSIS)
1000.0000 mL | Freq: Once | INTRAVENOUS | Status: AC
  Administered 2017-07-28: 1000 mL via INTRAVENOUS

## 2017-07-28 MED ORDER — ONDANSETRON HCL 4 MG/2ML IJ SOLN
4.0000 mg | Freq: Once | INTRAMUSCULAR | Status: AC
  Administered 2017-07-28: 4 mg via INTRAVENOUS
  Filled 2017-07-28: qty 2

## 2017-07-28 MED ORDER — KETOROLAC TROMETHAMINE 30 MG/ML IJ SOLN
30.0000 mg | Freq: Once | INTRAMUSCULAR | Status: AC
  Administered 2017-07-28: 30 mg via INTRAVENOUS
  Filled 2017-07-28: qty 1

## 2017-07-28 MED ORDER — FAMOTIDINE IN NACL 20-0.9 MG/50ML-% IV SOLN
20.0000 mg | Freq: Once | INTRAVENOUS | Status: AC
  Administered 2017-07-28: 20 mg via INTRAVENOUS
  Filled 2017-07-28: qty 50

## 2017-07-28 MED ORDER — IOPAMIDOL (ISOVUE-370) INJECTION 76%
100.0000 mL | Freq: Once | INTRAVENOUS | Status: AC | PRN
  Administered 2017-07-28: 100 mL via INTRAVENOUS

## 2017-07-28 NOTE — ED Notes (Signed)
Pt updated on plan of care, pt upset after receiving news that d-dimer was elevated, states " I am googling" comfort measures provided,

## 2017-07-28 NOTE — ED Notes (Addendum)
edp in prior to RN, see edp assessment for further, pt also c/o dizziness with standing,

## 2017-07-28 NOTE — ED Notes (Signed)
ED Provider at bedside. 

## 2017-07-28 NOTE — Discharge Instructions (Signed)
Continue your xanax. You might want to let Dr Charlott Rakes office know you are having more problems with anxiety.  You can take ibuprofen 600 mg 4 times a day for your chest pain. Use ice and heat for comfort.

## 2017-07-28 NOTE — ED Provider Notes (Signed)
[facility] DEPT Provider Note   CSN: [id number] Arrival date & time: 07/27/17  2314  Time seen 12:25 AM    History   Chief Complaint Chief Complaint  Patient presents with  . Chest Pain    HPI Debra Lewis is a 41 y.o. female.  HPI  Patient states she has been having chest pain constantly for the past 3-4 days.the pain is underneath her left breast and goes into her left middle chest. She states the pain is sharp. She has been also having left arm numbness and tingling for the past 3-4 days. She states everything got worse tonight. She denies nausea but started having vomiting in the ED. She denies cough but states she feels short of breath. She states taking a big deep breath or thinking about the stress in her life makes her chest hurt more. Nothing has made it feel better. She states when she got to the emergency department she felt like things were "closing in on her". She states she has a history of panic attacks which usually makes her feel short of breath and makes her head feel like it's spinning and she then feels dizzy. She states she's never had the chest pain before. She states she is having a bad month in September. Her son was sick with the flu for over a week, she had an infection in her scalp that had to be referred to a dermatologist to get better, her father had a stroke and now requires a lot of physical care, and her mother was recently in the hospital incident to a rehabilitation facility, which she states was "awful". So she brought her mother home. She states her mother has a lot of arthritis in her legs not able to walk. She states that as she is telling me about the stress in her life it makes the chest pain hurt more. She is treated by Dr. Tenny Craw, psychiatrist. She was last seen about 4 months ago. She is on Xanax as needed for anxiety.she also states she has migraine headaches and they are getting worse recently and not responding to her Imitrex.  Family history  is strongly positive for diabetes. Patient states her A1c keeps rising. She states her doctor started her on metformin but she cannot tolerate it due to nausea and vomiting. She states her father has had MIs, open heart surgery and then a stent. He also recently has stroke. Her mother has congestive heart failure. She states her son has asthma since he was an infant and is sick a lot.  PCP Richardean Chimera, MD Psychiatry Dr Tenny Craw  Past Medical History:  Diagnosis Date  . Abnormal pap   . ADHD (attention deficit hyperactivity disorder)   . Anxiety   . Asthma   . Bacterial vaginosis   . Chronic constipation   . Depression   . GERD (gastroesophageal reflux disease)   . History of frequent urinary tract infections   . HSV-2 (herpes simplex virus 2) infection   . Interstitial cystitis   . Migraines   . OCD (obsessive compulsive disorder)   . Pityriasis rosea     Patient Active Problem List   Diagnosis Date Noted  . Urinary tract infection, site not specified 04/16/2013  . Chronic interstitial cystitis 03/03/2013  . 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 WRIST PAIN, BILATERAL 12/26/2009  . KNEE SPRAIN 12/26/2009    Past Surgical History:  Procedure Laterality Date  . conization of cervix    . ESOPHAGEAL DILATION    .  ORIF ELBOW FRACTURE Right 10/01/2016   Procedure: RIGHT CAPITELLUM OPEN REDUCTION INTERNAL FIXATION;  Surgeon: Betha Loa, MD;  Location: [facility];  Service: Orthopedics;  Laterality: Right;    OB History    Gravida Para Term Preterm AB Living   SAB TAB Ectopic Multiple Live Births           1       Home Medications    Prior to Admission medications   Medication Sig Start Date End Date Taking? Authorizing Provider  levofloxacin (LEVAQUIN) 750 MG tablet Take 750 mg by mouth daily.   Yes [provider]  silver sulfADIAZINE (SILVADENE) 1 % cream Apply 1 application topically daily.   Yes [provider]    albuterol (PROVENTIL HFA;VENTOLIN HFA) 108 (90 BASE) MCG/ACT inhaler Inhale 2 puffs into the lungs every 6 (six) hours as needed for wheezing. Reported on 01/17/2016    [provider]  ALPRAZolam Prudy Feeler) 1 MG tablet Take 1 tablet (1 mg total) by mouth 3 (three) times daily as needed for sleep or anxiety. 03/30/15   Myrlene Broker, MD  amphetamine-dextroamphetamine (ADDERALL) 30 MG tablet Take 30 mg by mouth daily.  in morning and 20 mg at night    [provider]  cetirizine (ZYRTEC) 10 MG tablet Take 10 mg by mouth daily.    [provider]  fluconazole (DIFLUCAN) 150 MG tablet USE AS DIRECTED. 02/12/17   Lazaro Arms, MD  fluticasone (FLONASE) 50 MCG/ACT nasal spray Place 2 sprays into the nose daily.    [provider]  griseofulvin (GRIFULVIN V) 500 MG tablet Take 1 tablet (500 mg total) by mouth daily. 06/13/17   Samuel Jester, DO  hydrOXYzine (ATARAX/VISTARIL) 25 MG tablet Take 25 mg by mouth daily as needed for itching. Reported on 05/09/2016    [provider]  ibuprofen (ADVIL,MOTRIN) 800 MG tablet Take 800 mg by mouth daily as needed for pain (Associated with nerve damage).    [provider]  metroNIDAZOLE (METROGEL VAGINAL) 0.75 % vaginal gel Nightly x 5 nights 04/25/17   Lazaro Arms, MD  moxifloxacin (VIGAMOX) 0.5 % ophthalmic solution Place 1 drop into both eyes 3 (three) times daily.    [provider]  omeprazole (PRILOSEC) 20 MG capsule TAKE (1) CAPSULE BY MOUTH EVERY DAY. 04/06/17   Lazaro Arms, MD  PORTIA-28 0.15-30 MG-MCG tablet TAKE (1) TABLET BY MOUTH ONCE DAILY AS DIRECTED. 04/26/17   Lazaro Arms, MD  tiZANidine (ZANAFLEX) 2 MG tablet Take 4 mg by mouth every 6 (six) hours as needed for muscle spasms.    [provider]  triamcinolone cream (KENALOG) 0.5 % Apply 1 application topically daily as needed (FOR IRRITATION).     [provider]  URELLE (URELLE/URISED) 81 MG TABS tablet TAKE  1 TABLET BY MOUTH FOUR TIMES DAILY. 05/02/16   Lazaro Arms, MD    Family History Family History  Problem Relation Age of Onset  . Breast cancer Maternal Aunt   . Cancer Unknown   . Heart disease Unknown   . Diabetes Unknown   . Lung disease Unknown   . Lung cancer Sister   . Depression Mother   . Drug abuse Father     Social History Social History  Substance Use Topics  . Smoking status: Never Smoker  . Smokeless tobacco: Never Used  . Alcohol use Yes  Comment: socially  unemployed Caregiver to disabled parents Lives with her parents and 83 yo son   Allergies   Metrogel [metronidazole]; Penicillins; Codeine; Flagyl [metronidazole hcl]; and Sulfonamide derivatives   Review of Systems Review of Systems  All other systems reviewed and are negative.    Physical Exam Updated Vital Signs BP 128/86   Pulse 99   Temp 98.2 F (36.8 C) (Oral)   Resp (!) 24   Ht  (1.499 m)   Wt 81.4 kg (179 lb 6 oz)   SpO2 100%   BMI 36.23 kg/m   Vital signs normal    Physical Exam  Constitutional: She is oriented to person, place, and time. She appears well-developed and well-nourished.  Non-toxic appearance. She does not appear ill. She appears distressed.  During her interview patient is spitting clear fluid into a emesis bag. She denies having nausea.  HENT:  Head: Normocephalic and atraumatic.  Right Ear: External ear normal.  Left Ear: External ear normal.  Nose: Nose normal. No mucosal edema or rhinorrhea.  Mouth/Throat: Oropharynx is clear and moist and mucous membranes are normal. No dental abscesses or uvula swelling.  Eyes: Pupils are equal, round, and reactive to light. Conjunctivae and EOM are normal.  Neck: Normal range of motion and full passive range of motion without pain. Neck supple.  Cardiovascular: Normal rate, regular rhythm and normal heart sounds.  Exam reveals no gallop and no friction rub.   No murmur heard. Pulmonary/Chest: Effort normal and  breath sounds normal. No respiratory distress. She has no wheezes. She has no rhonchi. She has no rales. She exhibits tenderness. She exhibits no crepitus.    Patient has tenderness in her left mid costochondral junctions and in her left chest underneath her breasts.  Abdominal: Soft. Normal appearance and bowel sounds are normal. She exhibits no distension. There is no tenderness. There is no rebound and no guarding.  Musculoskeletal: Normal range of motion. She exhibits no edema or tenderness.  Moves all extremities well.   Neurological: She is alert and oriented to person, place, and time. She has normal strength. No cranial nerve deficit.  Skin: Skin is warm, dry and intact. No rash noted. No erythema. No pallor.  Psychiatric: Her mood appears anxious. Her speech is rapid and/or pressured. She is agitated.  Nursing note and vitals reviewed.    ED Treatments / Results  Labs (all labs ordered are listed, but only abnormal results are displayed) Results for orders placed or performed during the hospital encounter of 07/28/17  Comprehensive metabolic panel  Result Value Ref Range   Sodium 141 135 - 145 mmol/L   Potassium 3.5 3.5 - 5.1 mmol/L   Chloride 109 101 - 111 mmol/L   CO2 24 22 - 32 mmol/L   Glucose, Bld 118 (H) 65 - 99 mg/dL   BUN 6 6 - 20 mg/dL   Creatinine, Ser 1.61 0.44 - 1.00 mg/dL   Calcium 9.3 8.9 - 09.6 mg/dL   Total Protein 7.7 6.5 - 8.1 g/dL   Albumin 3.5 3.5 - 5.0 g/dL   AST 46 (H) 15 - 41 U/L   ALT 43 14 - 54 U/L   Alkaline Phosphatase 76 38 - 126 U/L   Total Bilirubin 0.6 0.3 - 1.2 mg/dL   GFR calc non Af Amer >60 >60 mL/min   GFR calc Af Amer >60 >60 mL/min   Anion gap 8 5 - 15  CBC with Differential  Result Value Ref Range  WBC 10.2 4.0 - 10.5 K/uL   RBC 4.82 3.87 - 5.11 MIL/uL   Hemoglobin 12.7 12.0 - 15.0 g/dL   HCT 91.4 78.2 - 95.6 %   MCV 80.1 78.0 - 100.0 fL   MCH 26.3 26.0 - 34.0 pg   MCHC 32.9 30.0 - 36.0 g/dL   RDW 21.3 08.6 - 57.8 %    Platelets 462 (H) 150 - 400 K/uL   Neutrophils Relative % 45 %   Neutro Abs 4.6 1.7 - 7.7 K/uL   Lymphocytes Relative 47 %   Lymphs Abs 4.8 (H) 0.7 - 4.0 K/uL   Monocytes Relative 6 %   Monocytes Absolute 0.6 0.1 - 1.0 K/uL   Eosinophils Relative 2 %   Eosinophils Absolute 0.2 0.0 - 0.7 K/uL   Basophils Relative 0 %   Basophils Absolute 0.0 0.0 - 0.1 K/uL  Troponin I  Result Value Ref Range   Troponin I <0.03 <0.03 ng/mL  Urinalysis, Routine w reflex microscopic  Result Value Ref Range   Color, Urine YELLOW YELLOW   APPearance HAZY (A) CLEAR   Specific Gravity, Urine 1.024 1.005 - 1.030   pH 5.0 5.0 - 8.0   Glucose, UA NEGATIVE NEGATIVE mg/dL   Hgb urine dipstick NEGATIVE NEGATIVE   Bilirubin Urine NEGATIVE NEGATIVE   Ketones, ur NEGATIVE NEGATIVE mg/dL   Protein, ur NEGATIVE NEGATIVE mg/dL   Nitrite NEGATIVE NEGATIVE   Leukocytes, UA NEGATIVE NEGATIVE  D-dimer, quantitative  Result Value Ref Range   D-Dimer, Quant 1.33 (H) 0.00 - 0.50 ug/mL-FEU  Lipase, blood  Result Value Ref Range   Lipase 28 11 - 51 U/L   Laboratory interpretation all normal except elevated ddimer    EKG  EKG Interpretation  Date/Time:  Sunday July 27 2017 23:41:06 EDT Ventricular Rate:  105 PR Interval:  124 QRS Duration: 64 QT Interval:  322 QTC Calculation: 425 R Axis:   68 Text Interpretation:  Sinus tachycardia Otherwise normal ECG Baseline wander Electrode noise No old tracing to compare Confirmed by Devoria Albe [phone]) on 07/28/2017 12:06:45 AM       Radiology Ct Angio Chest Pe W/cm &/or Wo Cm  Result Date: 07/28/2017 CLINICAL DATA:  Lambert Mody left-sided chest pain radiating to the left arm. Numbness. Positive D-dimer. EXAM: CT ANGIOGRAPHY CHEST WITH CONTRAST TECHNIQUE: Multidetector CT imaging of the chest was performed using the standard protocol during bolus administration of intravenous contrast. Multiplanar CT image reconstructions and MIPs were obtained to evaluate the vascular  anatomy. CONTRAST:  100 mL Isovue 370 COMPARISON:  None. FINDINGS: Cardiovascular: Satisfactory opacification of the pulmonary arteries to the segmental level. No evidence of pulmonary embolism. Normal heart size. No pericardial effusion. Normal caliber thoracic aorta. No evidence of aortic dissection. Great vessel origins are patent. Mediastinum/Nodes: No enlarged mediastinal, hilar, or axillary lymph nodes. Thyroid gland, trachea, and esophagus demonstrate no significant findings. Lungs/Pleura: Lungs are clear. No pleural effusion or pneumothorax. Upper Abdomen: No acute abnormality. Musculoskeletal: No chest wall abnormality. No acute or significant osseous findings. Review of the MIP images confirms the above findings. IMPRESSION: 1. No evidence of significant pulmonary embolus. 2. No evidence of active pulmonary disease. Electronically Signed   By: Burman Nieves M.D.   On: 07/28/2017 03:07    Procedures Procedures (including critical care time)  Medications Ordered in ED Medications  ketorolac (TORADOL) 30 MG/ML injection 30 mg (30 mg Intravenous Given 07/28/17 0055)  ondansetron (ZOFRAN) injection 4 mg (4 mg Intravenous Given 07/28/17 0055)  sodium chloride  0.9 % bolus 1,000 mL (0 mLs Intravenous Stopped 07/28/17 0217)  sodium chloride 0.9 % bolus 1,000 mL (1,000 mLs Intravenous New Bag/Given 07/28/17 0217)  famotidine (PEPCID) IVPB 20 mg premix (0 mg Intravenous Stopped 07/28/17 0217)  iopamidol (ISOVUE-370) 76 % injection 100 mL (100 mLs Intravenous Contrast Given 07/28/17 0246)     Initial Impression / Assessment and Plan / ED Course  I have reviewed the triage vital signs and the nursing notes.  Pertinent labs & imaging results that were available during my care of the patient were reviewed by me and considered in my medical decision making (see chart for details).    Patient states she can't take pain medicine because it makes her sick. She also wants something that she can drive home  afterwards. She was given IV fluids, IV Toradol and Pepcid.  Patient was noted to have an elevated d-dimer. We discussed getting a CP angiogram to look for blood clots she is agreeable.  At time of discharge we discussed her CT results. Her anus is most likely going to be chest wall pain from her anxiety. She can take ibuprofen over-the-counter and use ice and heat for comfort. She was advised to follow up with her psychiatrist to let her know that her anxiety is not being controlled well.  Final Clinical Impressions(s) / ED Diagnoses   Final diagnoses:  Atypical chest pain  Anxiety    New Prescriptions OTC ibuprofen  Devoria Albe, MD, FACEP  Devoria Albe, MD, Iline Oven, Jodelle Gross, MD 07/28/17 [phone]

## 2017-08-01 ENCOUNTER — Ambulatory Visit: Payer: Medicaid Other | Admitting: Obstetrics & Gynecology

## 2017-08-12 ENCOUNTER — Ambulatory Visit: Payer: Medicaid Other | Admitting: Obstetrics & Gynecology

## 2017-08-15 ENCOUNTER — Other Ambulatory Visit: Payer: Self-pay | Admitting: Obstetrics & Gynecology

## 2017-08-19 ENCOUNTER — Other Ambulatory Visit: Payer: Medicaid Other | Admitting: Obstetrics & Gynecology

## 2017-08-21 ENCOUNTER — Other Ambulatory Visit (HOSPITAL_COMMUNITY)
Admission: RE | Admit: 2017-08-21 | Discharge: 2017-08-21 | Disposition: A | Discharge status: Home / Self Care | Payer: Medicaid Other | Source: Ambulatory Visit | Attending: Obstetrics & Gynecology | Admitting: Obstetrics & Gynecology

## 2017-08-21 ENCOUNTER — Ambulatory Visit (INDEPENDENT_AMBULATORY_CARE_PROVIDER_SITE_OTHER): Payer: Medicaid Other | Admitting: Obstetrics & Gynecology

## 2017-08-21 ENCOUNTER — Encounter: Payer: Self-pay | Admitting: Obstetrics & Gynecology

## 2017-08-21 VITALS — BP 120/80 | HR 98 | Wt 181.0 lb

## 2017-08-21 DIAGNOSIS — Z0000 Encounter for general adult medical examination without abnormal findings: Secondary | ICD-10-CM

## 2017-08-21 DIAGNOSIS — R87810 Cervical high risk human papillomavirus (HPV) DNA test positive: Secondary | ICD-10-CM | POA: Insufficient documentation

## 2017-08-21 DIAGNOSIS — Z01419 Encounter for gynecological examination (general) (routine) without abnormal findings: Secondary | ICD-10-CM | POA: Diagnosis not present

## 2017-08-21 MED ORDER — TERCONAZOLE 0.4 % VA CREA: TOPICAL_CREAM | VAGINAL | 99 refills | Status: DC

## 2017-08-21 MED ORDER — URELLE 81 MG PO TABS: 1.0000 | ORAL_TABLET | Freq: Four times a day (QID) | ORAL | 3 refills | Status: DC

## 2017-08-21 MED ORDER — FLUCONAZOLE 150 MG PO TABS: 150.0000 mg | ORAL_TABLET | ORAL | 99 refills | Status: DC

## 2017-08-21 NOTE — Progress Notes (Signed)
Subjective:     Debra Lewis is a 41 y.o. female here for a routine exam.  No LMP recorded. Patient is not currently having periods (Reason: Irregular Periods). G1P1001 Birth Control Method:  OCP Menstrual Calendar(currently): Amenorrheic on OCPs Current complaints: Inability to lose weight, ongoing social issues with family.   Current acute medical issues:  Interstitial cystitis stable   Recent Gynecologic History No LMP recorded. Patient is not currently having periods (Reason: Irregular Periods). Last Pap: 2017,  normal Last mammogram: Patient has not yet started screening,    Past Medical History:  Diagnosis Date  . Abnormal pap   . ADHD (attention deficit hyperactivity disorder)   . Anxiety   . Asthma   . Bacterial vaginosis   . Chronic constipation   . Depression   . Diabetes mellitus without complication (HCC)   . GERD (gastroesophageal reflux disease)   . History of frequent urinary tract infections   . HSV-2 (herpes simplex virus 2) infection   . Interstitial cystitis   . Migraines   . OCD (obsessive compulsive disorder)   . Pityriasis rosea     Past Surgical History:  Procedure Laterality Date  . conization of cervix    . ESOPHAGEAL DILATION    . ORIF ELBOW FRACTURE Right 10/01/2016   Procedure: RIGHT CAPITELLUM OPEN REDUCTION INTERNAL FIXATION;  Surgeon: Betha Loa, MD;  Location: [facility];  Service: Orthopedics;  Laterality: Right;    OB History    Gravida Para Term Preterm AB Living   1 1 1     1    SAB TAB Ectopic Multiple Live Births           1      Social History   Social History  . Marital status: Single    Spouse name: N/A  . Number of children: N/A  . Years of education: N/A   Social History Main Topics  . Smoking status: Never Smoker  . Smokeless tobacco: Never Used  . Alcohol use Yes     Comment: socially  . Drug use: No  . Sexual activity: Not Currently    Birth control/ protection: Pill   Other Topics  Concern  . None   Social History Narrative  . None    Family History  Problem Relation Age of Onset  . Breast cancer Maternal Aunt   . Cancer Unknown   . Heart disease Unknown   . Diabetes Unknown   . Lung disease Unknown   . Lung cancer Sister   . Diabetes Sister   . Depression Mother   . Arthritis Mother   . Heart disease Mother   . Diabetes Mother   . Drug abuse Father   . Stroke Father      Current Outpatient Prescriptions:  .  albuterol (PROVENTIL HFA;VENTOLIN HFA) 108 (90 BASE) MCG/ACT inhaler, Inhale 2 puffs into the lungs every 6 (six) hours as needed for wheezing. Reported on 01/17/2016, Disp: , Rfl:  .  ALPRAZolam (XANAX) 1 MG tablet, Take 1 tablet (1 mg total) by mouth 3 (three) times daily as needed for sleep or anxiety., Disp: 90 tablet, Rfl: 2 .  cetirizine (ZYRTEC) 10 MG tablet, Take 10 mg by mouth daily., Disp: , Rfl:  .  fluconazole (DIFLUCAN) 150 MG tablet, Take 1 tablet (150 mg total) by mouth See admin instructions., Disp: 2 tablet, Rfl: PRN .  fluticasone (FLONASE) 50 MCG/ACT nasal spray, Place 2 sprays into the nose daily., Disp: , Rfl:  .  ibuprofen (ADVIL,MOTRIN) 800 MG tablet, Take 800 mg by mouth daily as needed for pain (Associated with nerve damage)., Disp: , Rfl:  .  moxifloxacin (VIGAMOX) 0.5 % ophthalmic solution, Place 1 drop into both eyes 3 (three) times daily., Disp: , Rfl:  .  omeprazole (PRILOSEC) 20 MG capsule, TAKE (1) CAPSULE BY MOUTH EVERY DAY., Disp: 30 capsule, Rfl: 6 .  PORTIA-28 0.15-30 MG-MCG tablet, TAKE (1) TABLET BY MOUTH ONCE DAILY AS DIRECTED., Disp: 28 tablet, Rfl: 12 .  terconazole (TERAZOL 7) 0.4 % vaginal cream, INSERT ONE APPLICATORFUL PER VAGINA AT BEDTIME., Disp: 45 g, Rfl: PRN .  tiZANidine (ZANAFLEX) 2 MG tablet, Take 4 mg by mouth every 6 (six) hours as needed for muscle spasms., Disp: , Rfl:  .  triamcinolone cream (KENALOG) 0.5 %, Apply 1 application topically daily as needed (FOR IRRITATION). , Disp: , Rfl:  .   URELLE (URELLE/URISED) 81 MG TABS tablet, Take 1 tablet (81 mg total) by mouth 4 (four) times daily., Disp: 120 tablet, Rfl: 3 .  amphetamine-dextroamphetamine (ADDERALL) 30 MG tablet, Take 30 mg by mouth daily. 30mg  in morning and 20 mg at night, Disp: , Rfl:  .  griseofulvin (GRIFULVIN V) 500 MG tablet, Take 1 tablet (500 mg total) by mouth daily. (Patient not taking: Reported on 08/21/2017), Disp: 21 tablet, Rfl: 0 .  hydrOXYzine (ATARAX/VISTARIL) 25 MG tablet, Take 25 mg by mouth daily as needed for itching. Reported on 05/09/2016, Disp: , Rfl:  .  levofloxacin (LEVAQUIN) 750 MG tablet, Take 750 mg by mouth daily., Disp: , Rfl:  .  metroNIDAZOLE (METROGEL VAGINAL) 0.75 % vaginal gel, Nightly x 5 nights (Patient not taking: Reported on 08/21/2017), Disp: 70 g, Rfl: 0 .  silver sulfADIAZINE (SILVADENE) 1 % cream, Apply 1 application topically daily., Disp: , Rfl:   Review of Systems  Review of Systems  Constitutional: Negative for fever, chills, weight loss, malaise/fatigue and diaphoresis.  HENT: Negative for hearing loss, ear pain, nosebleeds, congestion, sore throat, neck pain, tinnitus and ear discharge.   Eyes: Negative for blurred vision, double vision, photophobia, pain, discharge and redness.  Respiratory: Negative for cough, hemoptysis, sputum production, shortness of breath, wheezing and stridor.   Cardiovascular: Negative for chest pain, palpitations, orthopnea, claudication, leg swelling and PND.  Gastrointestinal: negative for abdominal pain. Negative for heartburn, nausea, vomiting, diarrhea, constipation, blood in stool and melena.  Genitourinary: Negative for dysuria, urgency, frequency, hematuria and flank pain.  Musculoskeletal: Negative for myalgias, back pain, joint pain and falls.  Skin: Negative for itching and rash.  Neurological: Negative for dizziness, tingling, tremors, sensory change, speech change, focal weakness, seizures, loss of consciousness, weakness and  headaches.  Endo/Heme/Allergies: Negative for environmental allergies and polydipsia. Does not bruise/bleed easily.  Psychiatric/Behavioral: Negative for depression, suicidal ideas, hallucinations, memory loss and substance abuse. The patient is not nervous/anxious and does not have insomnia.        Objective:  Blood pressure 120/80, pulse 98, weight 181 lb (82.1 kg).   Physical Exam  Vitals reviewed. Constitutional: She is oriented to person, place, and time. She appears well-developed and well-nourished.  HENT:  Head: Normocephalic and atraumatic.        Right Ear: External ear normal.  Left Ear: External ear normal.  Nose: Nose normal.  Mouth/Throat: Oropharynx is clear and moist.  Eyes: Conjunctivae and EOM are normal. Pupils are equal, round, and reactive to light. Right eye exhibits no discharge. Left eye exhibits no discharge. No scleral icterus.  Neck:  Normal range of motion. Neck supple. No tracheal deviation present. No thyromegaly present.  Cardiovascular: Normal rate, regular rhythm, normal heart sounds and intact distal pulses.  Exam reveals no gallop and no friction rub.   No murmur heard. Respiratory: Effort normal and breath sounds normal. No respiratory distress. She has no wheezes. She has no rales. She exhibits no tenderness.  GI: Soft. Bowel sounds are normal. She exhibits no distension and no mass. There is no tenderness. There is no rebound and no guarding.  Genitourinary:  Breasts no masses skin changes or nipple changes bilaterally      Vulva is normal without lesions Vagina is pink moist without discharge Cervix normal in appearance and pap is done Uterus is normal size shape and contour Adnexa is negative with normal sized ovaries   Musculoskeletal: Normal range of motion. She exhibits no edema and no tenderness.  Neurological: She is alert and oriented to person, place, and time. She has normal reflexes. She displays normal reflexes. No cranial nerve  deficit. She exhibits normal muscle tone. Coordination normal.  Skin: Skin is warm and dry. No rash noted. No erythema. No pallor.  Psychiatric: She has a normal mood and affect. Her behavior is normal. Judgment and thought content normal.       Medications Ordered at today's visit: Meds ordered this encounter  Medications  . URELLE (URELLE/URISED) 81 MG TABS tablet    Sig: Take 1 tablet (81 mg total) by mouth 4 (four) times daily.    Dispense:  120 tablet    Refill:  3  . fluconazole (DIFLUCAN) 150 MG tablet    Sig: Take 1 tablet (150 mg total) by mouth See admin instructions.    Dispense:  2 tablet    Refill:  PRN    Other orders placed at today's visit: No orders of the defined types were placed in this encounter.     Assessment:    Healthy female exam.    Plan:    Contraception: OCP (estrogen/progesterone). Mammogram ordered. Follow up in: prn weeks.   For scheduled bladder irrigation  Return in about 1 year (around 08/21/2018) for yearly, with Dr Despina Hidden.

## 2017-08-26 LAB — CYTOLOGY - PAP
Chlamydia: NEGATIVE
DIAGNOSIS: NEGATIVE
HPV (WINDOPATH): DETECTED — AB
Neisseria Gonorrhea: NEGATIVE

## 2017-08-31 ENCOUNTER — Encounter: Payer: Self-pay | Admitting: Obstetrics & Gynecology

## 2017-09-04 ENCOUNTER — Ambulatory Visit: Payer: Medicaid Other | Admitting: Obstetrics & Gynecology

## 2017-09-23 ENCOUNTER — Ambulatory Visit: Payer: Medicaid Other | Admitting: Obstetrics & Gynecology

## 2017-09-26 ENCOUNTER — Telehealth: Payer: Self-pay | Admitting: *Deleted

## 2017-09-26 ENCOUNTER — Ambulatory Visit: Payer: Medicaid Other | Admitting: Obstetrics & Gynecology

## 2017-09-26 NOTE — Telephone Encounter (Signed)
Patient called stating she just finished her last antibiotic and is having itching/discharge. She has taken her Diflucan but thinks she has BV and would like refill on Metrogel if possible.  She was scheduled for an appointment today but is unable to come because she is at the nursing home trying to get her mother admitted. Please advise.

## 2017-09-28 ENCOUNTER — Other Ambulatory Visit: Payer: Self-pay | Admitting: Obstetrics & Gynecology

## 2017-09-28 MED ORDER — METRONIDAZOLE 0.75 % VA GEL: VAGINAL | 0 refills | Status: DC

## 2017-10-06 ENCOUNTER — Ambulatory Visit: Payer: Medicaid Other | Admitting: Obstetrics & Gynecology

## 2017-10-09 ENCOUNTER — Ambulatory Visit: Payer: Medicaid Other | Admitting: Obstetrics & Gynecology

## 2017-12-30 ENCOUNTER — Ambulatory Visit: Payer: Medicaid Other | Admitting: Obstetrics & Gynecology

## 2018-01-08 ENCOUNTER — Other Ambulatory Visit: Payer: Self-pay

## 2018-01-08 ENCOUNTER — Ambulatory Visit: Payer: Medicaid Other | Admitting: Obstetrics & Gynecology

## 2018-01-08 ENCOUNTER — Encounter: Payer: Self-pay | Admitting: Obstetrics & Gynecology

## 2018-01-08 ENCOUNTER — Encounter (INDEPENDENT_AMBULATORY_CARE_PROVIDER_SITE_OTHER): Payer: Self-pay

## 2018-01-08 VITALS — BP 138/88 | HR 124 | Ht 59.0 in | Wt 175.0 lb

## 2018-01-08 DIAGNOSIS — N3010 Interstitial cystitis (chronic) without hematuria: Secondary | ICD-10-CM

## 2018-01-08 NOTE — Progress Notes (Signed)
Diagnosed with IC: several years ago  Current Meds:  Elmiron, DMSO Dietary restrictions    Pt states her symptoms have been stable, no exacerbations, although not perfect Wants to continue on this cycle  Blood pressure 138/88, pulse (!) 124, height 4\' 11"  (1.499 m), weight 175 lb (79.4 kg).    The external urethra meatus was prepped with betadine DMSO 50 cc was instilled in the usual fashion after the bladder was catheterized and emptied completely 50cc was instilled into the bladder without difficulty and the patient tolerated well She will refrain from voiding as long as possible  Follow up in 8 weeks, or as patient requests based on her symptom complex

## 2018-03-03 ENCOUNTER — Ambulatory Visit: Payer: Medicaid Other | Admitting: Obstetrics & Gynecology

## 2018-03-24 ENCOUNTER — Ambulatory Visit: Payer: Medicaid Other | Admitting: Obstetrics & Gynecology

## 2018-04-09 ENCOUNTER — Encounter: Payer: Self-pay | Admitting: Obstetrics & Gynecology

## 2018-04-09 ENCOUNTER — Ambulatory Visit: Payer: Medicaid Other | Admitting: Obstetrics & Gynecology

## 2018-04-09 VITALS — BP 125/82 | HR 112 | Ht 59.0 in | Wt 171.0 lb

## 2018-04-09 DIAGNOSIS — N3010 Interstitial cystitis (chronic) without hematuria: Secondary | ICD-10-CM

## 2018-04-09 NOTE — Progress Notes (Signed)
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    The external urethra meatus was prepped with betadine DMSO 50 cc was instilled in the usual fashion after the bladder was catheterized and emptied completely 50cc was instilled into the bladder without difficulty and the patient tolerated well She will refrain from voiding as long as possible  Follow up in 8 weeks, or as patient requests based on her symptom complex

## 2018-04-13 ENCOUNTER — Other Ambulatory Visit: Payer: Self-pay | Admitting: Obstetrics & Gynecology

## 2018-05-22 ENCOUNTER — Other Ambulatory Visit (HOSPITAL_COMMUNITY): Payer: Self-pay | Admitting: Internal Medicine

## 2018-05-22 DIAGNOSIS — Z1231 Encounter for screening mammogram for malignant neoplasm of breast: Secondary | ICD-10-CM

## 2018-05-25 ENCOUNTER — Ambulatory Visit (HOSPITAL_COMMUNITY): Payer: Medicaid Other

## 2018-06-02 ENCOUNTER — Ambulatory Visit: Payer: Medicaid Other | Admitting: Obstetrics & Gynecology

## 2018-06-12 ENCOUNTER — Ambulatory Visit: Payer: Medicaid Other | Admitting: Obstetrics & Gynecology

## 2018-06-25 ENCOUNTER — Other Ambulatory Visit: Payer: Self-pay | Admitting: Obstetrics & Gynecology

## 2018-07-31 ENCOUNTER — Ambulatory Visit: Payer: Medicaid Other | Admitting: Obstetrics & Gynecology

## 2018-08-06 ENCOUNTER — Ambulatory Visit: Payer: Medicaid Other | Admitting: Obstetrics & Gynecology

## 2018-08-18 ENCOUNTER — Ambulatory Visit: Payer: Medicaid Other | Admitting: Obstetrics & Gynecology

## 2018-08-25 ENCOUNTER — Ambulatory Visit: Payer: Medicaid Other | Admitting: Obstetrics & Gynecology

## 2018-08-26 ENCOUNTER — Other Ambulatory Visit: Payer: Self-pay | Admitting: Obstetrics & Gynecology

## 2018-09-04 ENCOUNTER — Ambulatory Visit: Payer: Medicaid Other | Admitting: Obstetrics & Gynecology

## 2018-09-11 ENCOUNTER — Ambulatory Visit: Payer: Medicaid Other | Admitting: Obstetrics & Gynecology

## 2018-09-22 ENCOUNTER — Ambulatory Visit: Payer: Medicaid Other | Admitting: Obstetrics & Gynecology

## 2018-09-29 ENCOUNTER — Other Ambulatory Visit: Payer: Medicaid Other | Admitting: Obstetrics & Gynecology

## 2018-10-08 ENCOUNTER — Ambulatory Visit: Payer: Medicaid Other | Admitting: Obstetrics & Gynecology

## 2018-10-13 ENCOUNTER — Other Ambulatory Visit: Payer: Medicaid Other | Admitting: Obstetrics & Gynecology

## 2018-10-26 ENCOUNTER — Ambulatory Visit: Payer: Medicaid Other | Admitting: Obstetrics & Gynecology

## 2018-10-27 ENCOUNTER — Encounter (INDEPENDENT_AMBULATORY_CARE_PROVIDER_SITE_OTHER): Payer: Self-pay

## 2018-10-27 ENCOUNTER — Encounter: Payer: Self-pay | Admitting: Obstetrics & Gynecology

## 2018-10-27 ENCOUNTER — Ambulatory Visit: Payer: Medicaid Other | Admitting: Obstetrics & Gynecology

## 2018-10-27 ENCOUNTER — Other Ambulatory Visit (HOSPITAL_COMMUNITY)
Admission: RE | Admit: 2018-10-27 | Discharge: 2018-10-27 | Disposition: A | Discharge status: Home / Self Care | Payer: Medicaid Other | Source: Ambulatory Visit | Attending: Obstetrics & Gynecology | Admitting: Obstetrics & Gynecology

## 2018-10-27 VITALS — Ht 59.0 in | Wt 158.5 lb

## 2018-10-27 DIAGNOSIS — Z124 Encounter for screening for malignant neoplasm of cervix: Secondary | ICD-10-CM | POA: Insufficient documentation

## 2018-10-27 DIAGNOSIS — Z01419 Encounter for gynecological examination (general) (routine) without abnormal findings: Secondary | ICD-10-CM

## 2018-10-27 DIAGNOSIS — Z0000 Encounter for general adult medical examination without abnormal findings: Secondary | ICD-10-CM

## 2018-10-27 MED ORDER — LEVONORGESTREL-ETHINYL ESTRAD 0.15-30 MG-MCG PO TABS: ORAL_TABLET | ORAL | 12 refills | Status: DC

## 2018-10-27 NOTE — Progress Notes (Signed)
Subjective:     Debra Lewis is a 42 y.o. female here for a routine exam.  No LMP recorded. (Menstrual status: Irregular Periods). G1P1001 Birth Control Method:  OCP Menstrual Calendar(currently): regular  Current complaints: none.   Current acute medical issues:  IC   Recent Gynecologic History No LMP recorded. (Menstrual status: Irregular Periods). Last Pap: 2018,  normal Last mammogram: 2018,  normal  Past Medical History:  Diagnosis Date  . Abnormal pap   . ADHD (attention deficit hyperactivity disorder)   . Anxiety   . Asthma   . Bacterial vaginosis   . Chronic constipation   . Depression   . Diabetes mellitus without complication (HCC)   . GERD (gastroesophageal reflux disease)   . History of frequent urinary tract infections   . HSV-2 (herpes simplex virus 2) infection   . Interstitial cystitis   . Migraines   . OCD (obsessive compulsive disorder)   . Pityriasis rosea     Past Surgical History:  Procedure Laterality Date  . conization of cervix    . ESOPHAGEAL DILATION    . ORIF ELBOW FRACTURE Right 10/01/2016   Procedure: RIGHT CAPITELLUM OPEN REDUCTION INTERNAL FIXATION;  Surgeon: Betha LoaKevin Kuzma, MD;  Location: [facility];  Service: Orthopedics;  Laterality: Right;    OB History    Gravida  1   Para  1   Term  1   Preterm      AB      Living  1     SAB      TAB      Ectopic      Multiple      Live Births  1           Social History   Socioeconomic History  . Marital status: Single    Spouse name: Not on file  . Number of children: Not on file  . Years of education: Not on file  . Highest education level: Not on file  Occupational History  . Not on file  Social Needs  . Financial resource strain: Not on file  . Food insecurity:    Worry: Not on file    Inability: Not on file  . Transportation needs:    Medical: Not on file    Non-medical: Not on file  Tobacco Use  . Smoking status: Never Smoker  .  Smokeless tobacco: Never Used  Substance and Sexual Activity  . Alcohol use: Yes    Comment: socially  . Drug use: No  . Sexual activity: Not Currently    Birth control/protection: Pill  Lifestyle  . Physical activity:    Days per week: Not on file    Minutes per session: Not on file  . Stress: Not on file  Relationships  . Social connections:    Talks on phone: Not on file    Gets together: Not on file    Attends religious service: Not on file    Active member of club or organization: Not on file    Attends meetings of clubs or organizations: Not on file    Relationship status: Not on file  Other Topics Concern  . Not on file  Social History Narrative  . Not on file    Family History  Problem Relation Age of Onset  . Breast cancer Maternal Aunt   . Cancer Other   . Heart disease Other   . Diabetes Other   . Lung disease Other   .  Lung cancer Sister   . Diabetes Sister   . Depression Mother   . Arthritis Mother   . Heart disease Mother   . Diabetes Mother   . Drug abuse Father   . Stroke Father   . Dementia Father      Current Outpatient Medications:  .  albuterol (PROVENTIL HFA;VENTOLIN HFA) 108 (90 BASE) MCG/ACT inhaler, Inhale 2 puffs into the lungs every 6 (six) hours as needed for wheezing. Reported on 01/17/2016, Disp: , Rfl:  .  ALPRAZolam (XANAX) 1 MG tablet, Take 1 tablet (1 mg total) by mouth 3 (three) times daily as needed for sleep or anxiety., Disp: 90 tablet, Rfl: 2 .  atorvastatin (LIPITOR) 20 MG tablet, Take 20 mg by mouth daily., Disp: , Rfl:  .  cetirizine (ZYRTEC) 10 MG tablet, Take 10 mg by mouth daily., Disp: , Rfl:  .  Empagliflozin-linaGLIPtin 10-5 MG TABS, Take by mouth daily. , Disp: , Rfl:  .  escitalopram (LEXAPRO) 20 MG tablet, Take 20 mg by mouth daily., Disp: , Rfl:  .  fluconazole (DIFLUCAN) 150 MG tablet, USE AS DIRECTED., Disp: 2 tablet, Rfl: 2 .  hydrOXYzine (ATARAX/VISTARIL) 25 MG tablet, Take 25 mg by mouth daily as needed for  itching. Reported on 05/09/2016, Disp: , Rfl:  .  ibuprofen (ADVIL,MOTRIN) 800 MG tablet, Take 800 mg by mouth daily as needed for pain (Associated with nerve damage)., Disp: , Rfl:  .  levonorgestrel-ethinyl estradiol (PORTIA-28) 0.15-30 MG-MCG tablet, TAKE (1) TABLET BY MOUTH ONCE DAILY AS DIRECTED., Disp: 28 tablet, Rfl: 12 .  moxifloxacin (VIGAMOX) 0.5 % ophthalmic solution, Place 1 drop into both eyes 3 (three) times daily., Disp: , Rfl:  .  omeprazole (PRILOSEC) 20 MG capsule, TAKE (1) CAPSULE BY MOUTH EVERY DAY., Disp: 30 capsule, Rfl: 11 .  pentosan polysulfate (ELMIRON) 100 MG capsule, Take 100 mg by mouth 3 (three) times daily., Disp: , Rfl:  .  PORTIA-28 0.15-30 MG-MCG tablet, TAKE (1) TABLET BY MOUTH ONCE DAILY AS DIRECTED., Disp: 28 tablet, Rfl: 12 .  tiZANidine (ZANAFLEX) 2 MG tablet, Take 4 mg by mouth every 6 (six) hours as needed for muscle spasms., Disp: , Rfl:  .  triamcinolone cream (KENALOG) 0.5 %, Apply 1 application topically daily as needed (FOR IRRITATION). , Disp: , Rfl:  .  URELLE (URELLE/URISED) 81 MG TABS tablet, Take 1 tablet (81 mg total) by mouth 4 (four) times daily., Disp: 120 tablet, Rfl: 3  Review of Systems  Review of Systems  Constitutional: Negative for fever, chills, weight loss, malaise/fatigue and diaphoresis.  HENT: Negative for hearing loss, ear pain, nosebleeds, congestion, sore throat, neck pain, tinnitus and ear discharge.   Eyes: Negative for blurred vision, double vision, photophobia, pain, discharge and redness.  Respiratory: Negative for cough, hemoptysis, sputum production, shortness of breath, wheezing and stridor.   Cardiovascular: Negative for chest pain, palpitations, orthopnea, claudication, leg swelling and PND.  Gastrointestinal: negative for abdominal pain. Negative for heartburn, nausea, vomiting, diarrhea, constipation, blood in stool and melena.  Genitourinary: Negative for dysuria, urgency, frequency, hematuria and flank pain.   Musculoskeletal: Negative for myalgias, back pain, joint pain and falls.  Skin: Negative for itching and rash.  Neurological: Negative for dizziness, tingling, tremors, sensory change, speech change, focal weakness, seizures, loss of consciousness, weakness and headaches.  Endo/Heme/Allergies: Negative for environmental allergies and polydipsia. Does not bruise/bleed easily.  Psychiatric/Behavioral: Negative for depression, suicidal ideas, hallucinations, memory loss and substance abuse. The patient is not nervous/anxious and  does not have insomnia.        Objective:  Height 4\' 11"  (1.499 m), weight 158 lb 8 oz (71.9 kg).   Physical Exam  Vitals reviewed. Constitutional: She is oriented to person, place, and time. She appears well-developed and well-nourished.  HENT:  Head: Normocephalic and atraumatic.        Right Ear: External ear normal.  Left Ear: External ear normal.  Nose: Nose normal.  Mouth/Throat: Oropharynx is clear and moist.  Eyes: Conjunctivae and EOM are normal. Pupils are equal, round, and reactive to light. Right eye exhibits no discharge. Left eye exhibits no discharge. No scleral icterus.  Neck: Normal range of motion. Neck supple. No tracheal deviation present. No thyromegaly present.  Cardiovascular: Normal rate, regular rhythm, normal heart sounds and intact distal pulses.  Exam reveals no gallop and no friction rub.   No murmur heard. Respiratory: Effort normal and breath sounds normal. No respiratory distress. She has no wheezes. She has no rales. She exhibits no tenderness.  GI: Soft. Bowel sounds are normal. She exhibits no distension and no mass. There is no tenderness. There is no rebound and no guarding.  Genitourinary:  Breasts no masses skin changes or nipple changes bilaterally      Vulva is normal without lesions Vagina is pink moist without discharge Cervix normal in appearance and pap is done Uterus is normal size shape and contour Adnexa is  negative with normal sized ovaries   Musculoskeletal: Normal range of motion. She exhibits no edema and no tenderness.  Neurological: She is alert and oriented to person, place, and time. She has normal reflexes. She displays normal reflexes. No cranial nerve deficit. She exhibits normal muscle tone. Coordination normal.  Skin: Skin is warm and dry. No rash noted. No erythema. No pallor.  Psychiatric: She has a normal mood and affect. Her behavior is normal. Judgment and thought content normal.       Medications Ordered at today's visit: Meds ordered this encounter  Medications  . levonorgestrel-ethinyl estradiol (PORTIA-28) 0.15-30 MG-MCG tablet    Sig: TAKE (1) TABLET BY MOUTH ONCE DAILY AS DIRECTED.    Dispense:  28 tablet    Refill:  12    Other orders placed at today's visit: No orders of the defined types were placed in this encounter.     Assessment:    Healthy female exam.    Plan:    Contraception: OCP (estrogen/progesterone).     Return in about 2 weeks (around 11/10/2018) for bladder irrigtion, with Dr Despina HiddenEure.

## 2018-10-30 LAB — CYTOLOGY - PAP
Chlamydia: NEGATIVE
Diagnosis: NEGATIVE
HPV: DETECTED — AB
Neisseria Gonorrhea: NEGATIVE

## 2018-11-10 ENCOUNTER — Ambulatory Visit: Payer: Medicaid Other | Admitting: Obstetrics & Gynecology

## 2018-11-23 ENCOUNTER — Ambulatory Visit: Payer: Medicaid Other | Admitting: Obstetrics & Gynecology

## 2018-11-27 ENCOUNTER — Ambulatory Visit: Payer: Medicaid Other | Admitting: Obstetrics & Gynecology

## 2018-12-02 ENCOUNTER — Other Ambulatory Visit: Payer: Self-pay | Admitting: Obstetrics & Gynecology

## 2018-12-02 ENCOUNTER — Telehealth: Payer: Self-pay | Admitting: Obstetrics & Gynecology

## 2018-12-02 NOTE — Telephone Encounter (Signed)
Pt requests that a 12 months supply of kenalog cream and diflucan be sent to her pharmacy. Advised that I would send her request to Dr Despina Hidden and she could check with her pharmacy tomorrow.  Pt verbalized understanding.

## 2018-12-02 NOTE — Telephone Encounter (Signed)
Patient called stating that she would like for Dr. Despina Hidden to call her in a years worth of her Diflucan and the cream for yeast. Pt states that he normal does this. Please contact pt

## 2018-12-04 ENCOUNTER — Other Ambulatory Visit: Payer: Self-pay | Admitting: Obstetrics & Gynecology

## 2018-12-04 MED ORDER — FLUCONAZOLE 150 MG PO TABS: ORAL_TABLET | ORAL | 12 refills | Status: AC

## 2018-12-04 MED ORDER — TRIAMCINOLONE ACETONIDE 0.5 % EX CREA: 1.0000 "application " | TOPICAL_CREAM | Freq: Every day | CUTANEOUS | 11 refills | Status: AC | PRN

## 2018-12-04 NOTE — Telephone Encounter (Signed)
I believe I did it correctly for 1 year, hope so

## 2018-12-15 ENCOUNTER — Ambulatory Visit: Payer: Medicaid Other | Admitting: Obstetrics & Gynecology

## 2018-12-24 ENCOUNTER — Ambulatory Visit: Payer: Medicaid Other | Admitting: Obstetrics & Gynecology

## 2019-01-04 ENCOUNTER — Ambulatory Visit: Payer: Self-pay | Admitting: Obstetrics & Gynecology

## 2019-01-15 ENCOUNTER — Ambulatory Visit: Payer: Self-pay | Admitting: Obstetrics & Gynecology

## 2019-01-21 ENCOUNTER — Telehealth: Payer: Self-pay | Admitting: *Deleted

## 2019-01-21 NOTE — Telephone Encounter (Signed)
Patient informed that we are not allowing visitors or children to come to appointments at this time. Patient denies any contact with anyone suspected or confirmed of having COVID-19. Pt denies fever, cough, sob, muscle pain, diarrhea, rash, vomiting, abdominal pain, red eye, weakness, bruising or bleeding, joint pain or severe headache.  

## 2019-01-22 ENCOUNTER — Ambulatory Visit: Payer: Self-pay | Admitting: Obstetrics & Gynecology

## 2019-02-01 ENCOUNTER — Telehealth: Payer: Self-pay | Admitting: *Deleted

## 2019-02-01 NOTE — Telephone Encounter (Signed)
Patient informed that we are not allowing visitors or children to come to appointments at this time. Patient denies any contact with anyone suspected or confirmed of having COVID-19. Pt denies fever, cough, sob, muscle pain, diarrhea, rash, vomiting, abdominal pain, red eye, weakness, bruising or bleeding, joint pain or severe headache.  

## 2019-02-02 ENCOUNTER — Telehealth: Payer: Self-pay | Admitting: Obstetrics & Gynecology

## 2019-02-02 ENCOUNTER — Ambulatory Visit: Payer: Self-pay | Admitting: Obstetrics & Gynecology

## 2019-02-02 MED ORDER — TERCONAZOLE 0.4 % VA CREA: 1.0000 | TOPICAL_CREAM | Freq: Every day | VAGINAL | 0 refills | Status: DC

## 2019-02-02 NOTE — Telephone Encounter (Signed)
Left message letting know she can check with [facility] within 24 hours if she don't hear back from office. Thanks!!  JSY

## 2019-02-02 NOTE — Telephone Encounter (Signed)
Pt states that the wrong vaginal cream was sent in for her and she would like to see if the terconazole (TERAZOL 7) 0.4 % vaginal cream can be sent in for her.

## 2019-02-16 ENCOUNTER — Ambulatory Visit: Payer: Self-pay | Admitting: Obstetrics & Gynecology

## 2019-02-21 ENCOUNTER — Encounter: Payer: Self-pay | Admitting: Obstetrics & Gynecology

## 2019-02-22 ENCOUNTER — Telehealth: Payer: Self-pay | Admitting: *Deleted

## 2019-02-22 NOTE — Telephone Encounter (Signed)

## 2019-02-23 ENCOUNTER — Ambulatory Visit: Payer: Self-pay | Admitting: Obstetrics & Gynecology

## 2019-02-26 ENCOUNTER — Ambulatory Visit: Payer: Medicaid Other | Admitting: Obstetrics & Gynecology

## 2019-03-02 ENCOUNTER — Ambulatory Visit: Payer: Medicaid Other | Admitting: Obstetrics & Gynecology

## 2019-03-04 ENCOUNTER — Telehealth: Payer: Self-pay | Admitting: *Deleted

## 2019-03-04 NOTE — Telephone Encounter (Signed)

## 2019-03-05 ENCOUNTER — Other Ambulatory Visit: Payer: Self-pay

## 2019-03-05 ENCOUNTER — Ambulatory Visit (INDEPENDENT_AMBULATORY_CARE_PROVIDER_SITE_OTHER): Payer: Medicaid Other | Admitting: Obstetrics & Gynecology

## 2019-03-05 ENCOUNTER — Encounter: Payer: Self-pay | Admitting: Obstetrics & Gynecology

## 2019-03-05 VITALS — Ht 59.0 in | Wt 174.4 lb

## 2019-03-05 DIAGNOSIS — N3010 Interstitial cystitis (chronic) without hematuria: Secondary | ICD-10-CM

## 2019-03-05 NOTE — Progress Notes (Signed)
Diagnosed with IC: years ago  Current Meds:  DMSO, elmiron Dietary restrictions    Pt states her symptoms have been stable, no exacerbations, although not perfect Wants to continue on this cycle  Height 4\' 11"  (1.499 m), weight 174 lb 6.4 oz (79.1 kg).    The external urethra meatus was prepped with betadine DMSO 50 cc was instilled in the usual fashion after the bladder was catheterized and emptied completely 50cc was instilled into the bladder without difficulty and the patient tolerated well She will refrain from voiding as long as possible  Follow up in 8 weeks, or as patient requests based on her symptom complex

## 2019-03-18 ENCOUNTER — Other Ambulatory Visit: Payer: Self-pay | Admitting: Obstetrics & Gynecology

## 2019-04-29 ENCOUNTER — Ambulatory Visit: Payer: Medicaid Other | Admitting: Obstetrics & Gynecology

## 2019-05-03 ENCOUNTER — Telehealth: Payer: Self-pay | Admitting: Obstetrics & Gynecology

## 2019-05-04 ENCOUNTER — Ambulatory Visit: Payer: Medicaid Other | Admitting: Obstetrics & Gynecology

## 2019-05-11 ENCOUNTER — Ambulatory Visit: Payer: Medicaid Other | Admitting: Obstetrics & Gynecology

## 2019-05-18 ENCOUNTER — Other Ambulatory Visit: Payer: Self-pay | Admitting: Obstetrics & Gynecology

## 2019-05-24 ENCOUNTER — Telehealth: Payer: Self-pay | Admitting: Obstetrics & Gynecology

## 2019-05-24 NOTE — Telephone Encounter (Signed)

## 2019-05-25 ENCOUNTER — Ambulatory Visit: Payer: Medicaid Other | Admitting: Obstetrics & Gynecology

## 2019-06-03 ENCOUNTER — Telehealth: Payer: Self-pay | Admitting: Obstetrics & Gynecology

## 2019-06-03 NOTE — Telephone Encounter (Signed)

## 2019-06-07 ENCOUNTER — Ambulatory Visit: Payer: Medicaid Other | Admitting: Obstetrics & Gynecology

## 2019-06-16 ENCOUNTER — Telehealth: Payer: Self-pay | Admitting: Obstetrics & Gynecology

## 2019-06-16 NOTE — Telephone Encounter (Signed)
Called patient and left message informing her that we are not allowing any visitors or children to come to visit with her at this time and we are requiring a mask to be worn during the visit. Asked if she has had any exposure to anyone suspected or confirmed of having COVID-19 or if she is experiencing any of the following: fever, cough, sob, muscle pain, severe headache, sore throat, diarrhea, loss of taste or smell or ear, nose or throat discomfort to call and reschedule.   °

## 2019-06-17 ENCOUNTER — Ambulatory Visit: Payer: Medicaid Other | Admitting: Obstetrics & Gynecology

## 2019-06-23 NOTE — Telephone Encounter (Signed)
ERROR

## 2019-06-28 ENCOUNTER — Telehealth: Payer: Self-pay | Admitting: Obstetrics & Gynecology

## 2019-06-28 NOTE — Telephone Encounter (Signed)
Called patient and left message informing her that we are not allowing any visitors or children to come to visit with her at this time and we are requiring a mask to be worn during the visit. Asked if she has had any exposure to anyone suspected or confirmed of having COVID-19 or if she is experiencing any of the following: fever, cough, sob, muscle pain, severe headache, sore throat, diarrhea, loss of taste or smell or ear, nose or throat discomfort to call and reschedule.   °

## 2019-06-29 ENCOUNTER — Telehealth: Payer: Self-pay | Admitting: Obstetrics & Gynecology

## 2019-06-29 ENCOUNTER — Telehealth: Payer: Self-pay | Admitting: *Deleted

## 2019-06-29 ENCOUNTER — Ambulatory Visit: Payer: Medicaid Other | Admitting: Obstetrics & Gynecology

## 2019-06-29 MED ORDER — PENTOSAN POLYSULFATE SODIUM 100 MG PO CAPS: 100.0000 mg | ORAL_CAPSULE | Freq: Three times a day (TID) | ORAL | 11 refills | Status: DC

## 2019-06-29 MED ORDER — CLOBETASOL PROPIONATE 0.05 % EX CREA: 1.0000 "application " | TOPICAL_CREAM | Freq: Two times a day (BID) | CUTANEOUS | 11 refills | Status: DC

## 2019-06-29 NOTE — Telephone Encounter (Signed)
done

## 2019-06-29 NOTE — Telephone Encounter (Signed)
Pt needs a refill of pentosan polysulfate (ELMIRON) 100 MG capsule  And needs a refill of Euro for bladder spasms. Also, clobetasol cream for a year. Sent to [facility] in [place].

## 2019-06-29 NOTE — Telephone Encounter (Signed)
LMOVM that Dr Elonda Husky has sent in requested prescriptions to her pharmacy.

## 2019-06-29 NOTE — Telephone Encounter (Signed)
Patient called requesting a refill on Elmiron and Clobetasol Cream.

## 2019-07-08 ENCOUNTER — Telehealth: Payer: Self-pay | Admitting: Obstetrics & Gynecology

## 2019-07-08 NOTE — Telephone Encounter (Signed)
Called patient regarding appointment scheduled in our office encouraged to come alone to the visit if possible, however, a support person, over age 45, may accompany her  to appointment if assistance is needed for safety or care concerns. Otherwise, support persons should remain outside until the visit is complete.  ° °We ask if you have had any exposure to anyone suspected or confirmed of having COVID-19 or if you are experiencing any of the following, to call and reschedule your appointment: fever, cough, shortness of breath, muscle pain, diarrhea, rash, vomiting, abdominal pain, red eye, weakness, bruising, bleeding, joint pain, or a severe headache.  ° °Please know we will ask you these questions or similar questions when you arrive for your appointment and again it’s how we are keeping everyone safe.   ° °Also,to keep you safe, please use the provided hand sanitizer when you enter the office. We are asking everyone in the office to wear a mask to help prevent the spread of °germs. If you have a mask of your own, please wear it to your appointment, if not, we are happy to provide one for you. ° °Thank you for understanding and your cooperation.  ° ° °[facility] Staff ° ° ° °

## 2019-07-09 ENCOUNTER — Other Ambulatory Visit: Payer: Self-pay | Admitting: *Deleted

## 2019-07-09 ENCOUNTER — Ambulatory Visit: Payer: Medicaid Other | Admitting: Obstetrics & Gynecology

## 2019-07-09 MED ORDER — URELLE 81 MG PO TABS: 1.0000 | ORAL_TABLET | Freq: Four times a day (QID) | ORAL | 3 refills | Status: DC

## 2019-07-09 NOTE — Telephone Encounter (Signed)
Calling in regards to [place].

## 2019-07-09 NOTE — Addendum Note (Signed)
Addended by: Christiana Pellant A on: 07/09/2019 01:54 PM   Modules accepted: Orders

## 2019-07-19 ENCOUNTER — Telehealth: Payer: Self-pay | Admitting: Obstetrics & Gynecology

## 2019-07-19 NOTE — Telephone Encounter (Signed)
Called patient regarding appointment and the following message was left: ° ° °We have you scheduled for an upcoming appointment at our office. At this time, patients are encouraged to come alone to their visits whenever possible, however, a support person, over age 45, may accompany you to your appointment if assistance is needed for safety or care concerns. Otherwise, support persons should remain outside until the visit is complete.  ° °We ask if you have had any exposure to anyone suspected or confirmed of having COVID-19 or if you are experiencing any of the following, to call and reschedule your appointment: fever, cough, shortness of breath, muscle pain, diarrhea, rash, vomiting, abdominal pain, red eye, weakness, bruising, bleeding, joint pain, or a severe headache.  ° °Please know we will ask you these questions or similar questions when you arrive for your appointment and again it’s how we are keeping everyone safe.   ° °Also,to keep you safe, please use the provided hand sanitizer when you enter the office. We are asking everyone in the office to wear a mask to help prevent the spread of °germs. If you have a mask of your own, please wear it to your appointment, if not, we are happy to provide one for you. ° °Thank you for understanding and your cooperation.  ° ° °[facility] Staff ° ° ° ° ° °

## 2019-07-20 ENCOUNTER — Other Ambulatory Visit: Payer: Self-pay

## 2019-07-20 ENCOUNTER — Ambulatory Visit: Payer: Medicaid Other | Admitting: Obstetrics & Gynecology

## 2019-07-20 ENCOUNTER — Encounter: Payer: Self-pay | Admitting: Obstetrics & Gynecology

## 2019-07-20 VITALS — BP 130/86 | HR 103 | Ht 59.0 in | Wt 167.0 lb

## 2019-07-20 DIAGNOSIS — N3010 Interstitial cystitis (chronic) without hematuria: Secondary | ICD-10-CM | POA: Diagnosis not present

## 2019-07-20 NOTE — Progress Notes (Signed)
Diagnosed with IC: years ago  Current Meds:  Elmiron + DMSO Dietary restrictions    Pt states her symptoms have been stable, no exacerbations, although not perfect Wants to continue on this cycle  Blood pressure 130/86, pulse (!) 103, height 4\' 11"  (1.499 m), weight 167 lb (75.8 kg).    The external urethra meatus was prepped with betadine DMSO 50 cc was instilled in the usual fashion after the bladder was catheterized and emptied completely 50cc was instilled into the bladder without difficulty and the patient tolerated well She will refrain from voiding as long as possible  Follow up in 8 weeks, or as patient requests based on her symptom complex

## 2019-07-23 ENCOUNTER — Other Ambulatory Visit (HOSPITAL_COMMUNITY): Payer: Self-pay | Admitting: Internal Medicine

## 2019-07-23 DIAGNOSIS — Z1231 Encounter for screening mammogram for malignant neoplasm of breast: Secondary | ICD-10-CM

## 2019-07-27 ENCOUNTER — Other Ambulatory Visit: Payer: Self-pay

## 2019-07-27 ENCOUNTER — Ambulatory Visit
Admission: EM | Admit: 2019-07-27 | Discharge: 2019-07-27 | Disposition: A | Discharge status: Home / Self Care | Payer: Medicaid Other | Attending: Emergency Medicine | Admitting: Emergency Medicine

## 2019-07-27 DIAGNOSIS — R0981 Nasal congestion: Secondary | ICD-10-CM

## 2019-07-27 DIAGNOSIS — J029 Acute pharyngitis, unspecified: Secondary | ICD-10-CM | POA: Diagnosis not present

## 2019-07-27 DIAGNOSIS — R51 Headache: Secondary | ICD-10-CM | POA: Diagnosis not present

## 2019-07-27 DIAGNOSIS — Z20828 Contact with and (suspected) exposure to other viral communicable diseases: Secondary | ICD-10-CM

## 2019-07-27 DIAGNOSIS — Z20822 Contact with and (suspected) exposure to covid-19: Secondary | ICD-10-CM

## 2019-07-27 NOTE — Discharge Instructions (Signed)
COVID testing ordered.  It will take between 5-7 days for test results.  Someone will contact you regarding abnormal results.    In the meantime: You should remain isolated in your home for 10 days from symptom onset AND greater than 72 hours after symptoms resolution (absence of fever without the use of fever-reducing medication and improvement in respiratory symptoms), whichever is longer Get plenty of rest and push fluids Use OTC zyrtec and/or flonase as needed for congestion and/or runny nose Use medications daily for symptom relief Use OTC medications like ibuprofen or tylenol as needed fever or pain Call or go to the ED if you have any new or worsening symptoms such as fever, worsening cough, shortness of breath, chest tightness, chest pain, turning blue, changes in mental status, etc..Marland Kitchen

## 2019-07-27 NOTE — ED Triage Notes (Signed)
Pt had contact with someone who is covid wants testing

## 2019-07-27 NOTE — ED Provider Notes (Signed)
[place]   [id number] 07/27/19 Arrival Time: 1632   CC: COVID exposure; uri symptoms   SUBJECTIVE: History from: patient.  Debra Lewis is a 43 y.o. female who presents with runny nose, congestion, sore throat, and HA x 1-3 days .  Does admit to COVID exposure to sister. Has not tried OTC medications.  Denies aggravating factors.  Denies previous symptoms in the past.   Denies fever, chills, fatigue, SOB, wheezing, chest pain, nausea, changes in bowel or bladder habits.    ROS: As per HPI.  All other pertinent ROS negative.     Past Medical History:  Diagnosis Date   Abnormal pap    ADHD (attention deficit hyperactivity disorder)    Anxiety    Asthma    Bacterial vaginosis    Chronic constipation    Depression    Diabetes mellitus without complication (HCC)    GERD (gastroesophageal reflux disease)    History of frequent urinary tract infections    HSV-2 (herpes simplex virus 2) infection    Interstitial cystitis    Migraines    OCD (obsessive compulsive disorder)    Pityriasis rosea    Past Surgical History:  Procedure Laterality Date   conization of cervix     ESOPHAGEAL DILATION     ORIF ELBOW FRACTURE Right 10/01/2016   Procedure: RIGHT CAPITELLUM OPEN REDUCTION INTERNAL FIXATION;  Surgeon: Leanora Cover, MD;  Location: [facility];  Service: Orthopedics;  Laterality: Right;   Allergies  Allergen Reactions   Latex Anaphylaxis    She is not allergic to latex, but her son has the anaphylaxis reaction   Penicillins Nausea Only    Sick on stomach   Codeine Rash    Hives and itching   Flagyl [Metronidazole Hcl] Nausea And Vomiting, Swelling and Rash   Sulfonamide Derivatives Swelling and Rash   No current facility-administered medications on file prior to encounter.    Current Outpatient Medications on File Prior to Encounter  Medication Sig Dispense Refill   albuterol (PROVENTIL HFA;VENTOLIN HFA) 108 (90 BASE)  MCG/ACT inhaler Inhale 2 puffs into the lungs every 6 (six) hours as needed for wheezing. Reported on 01/17/2016     ALPRAZolam (XANAX) 1 MG tablet Take 1 tablet (1 mg total) by mouth 3 (three) times daily as needed for sleep or anxiety. 90 tablet 2   atorvastatin (LIPITOR) 20 MG tablet Take 20 mg by mouth daily.     cetirizine (ZYRTEC) 10 MG tablet Take 10 mg by mouth daily.     clobetasol cream (TEMOVATE) 0.10 % Apply 1 application topically 2 (two) times daily. 30 g 11   Empagliflozin-linaGLIPtin 10-5 MG TABS Take 25 mg by mouth daily.      escitalopram (LEXAPRO) 20 MG tablet Take 20 mg by mouth daily.     fluconazole (DIFLUCAN) 150 MG tablet USE AS DIRECTED. 2 tablet 12   hydrOXYzine (ATARAX/VISTARIL) 25 MG tablet Take 25 mg by mouth daily as needed for itching. Reported on 05/09/2016     ibuprofen (ADVIL,MOTRIN) 800 MG tablet Take 800 mg by mouth daily as needed for pain (Associated with nerve damage).     levonorgestrel-ethinyl estradiol (PORTIA-28) 0.15-30 MG-MCG tablet TAKE (1) TABLET BY MOUTH ONCE DAILY AS DIRECTED. 28 tablet 12   moxifloxacin (VIGAMOX) 0.5 % ophthalmic solution Place 1 drop into both eyes 3 (three) times daily.     omeprazole (PRILOSEC) 20 MG capsule TAKE (1) CAPSULE BY MOUTH EVERY DAY. 30 capsule 11  pentosan polysulfate (ELMIRON) 100 MG capsule Take 1 capsule (100 mg total) by mouth 3 (three) times daily. 90 capsule 11   PORTIA-28 0.15-30 MG-MCG tablet TAKE (1) TABLET BY MOUTH ONCE DAILY AS DIRECTED. 28 tablet 12   terconazole (TERAZOL 7) 0.4 % vaginal cream Place 1 applicator vaginally at bedtime. 45 g 0   tiZANidine (ZANAFLEX) 2 MG tablet Take 4 mg by mouth every 6 (six) hours as needed for muscle spasms.     triamcinolone cream (KENALOG) 0.5 % Apply 1 application topically daily as needed (FOR IRRITATION). 30 g 11   Urelle (URELLE/URISED) 81 MG TABS tablet Take 1 tablet (81 mg total) by mouth 4 (four) times daily. 120 tablet 3   VIT  B12-METHIONINE-INOS-CHOL IM Inject into the muscle every 30 (thirty) days.     VITAMIN D PO Take by mouth once a week.     Social History   Socioeconomic History   Marital status: Single    Spouse name: Not on file   Number of children: 1   Years of education: Not on file   Highest education level: Not on file  Occupational History   Not on file  Social Needs   Financial resource strain: Not on file   Food insecurity    Worry: Not on file    Inability: Not on file   Transportation needs    Medical: Not on file    Non-medical: Not on file  Tobacco Use   Smoking status: Never Smoker   Smokeless tobacco: Never Used  Substance and Sexual Activity   Alcohol use: Yes    Comment: socially   Drug use: No   Sexual activity: Not Currently    Birth control/protection: Pill  Lifestyle   Physical activity    Days per week: Not on file    Minutes per session: Not on file   Stress: Not on file  Relationships   Social connections    Talks on phone: Not on file    Gets together: Not on file    Attends religious service: Not on file    Active member of club or organization: Not on file    Attends meetings of clubs or organizations: Not on file    Relationship status: Not on file   Intimate partner violence    Fear of current or ex partner: Not on file    Emotionally abused: Not on file    Physically abused: Not on file    Forced sexual activity: Not on file  Other Topics Concern   Not on file  Social History Narrative   Not on file   Family History  Problem Relation Age of Onset   Breast cancer Maternal Aunt    Cancer Other    Heart disease Other    Diabetes Other    Lung disease Other    Lung cancer Sister    Diabetes Sister    Depression Mother    Arthritis Mother    Heart disease Mother    Diabetes Mother    Drug abuse Father    Stroke Father    Dementia Father    Asthma Son     OBJECTIVE:  Vitals:   07/27/19 1642  BP:  136/85  Pulse: (!) 102  Resp: 20  Temp: 98.5 F (36.9 C)  SpO2: 97%     General appearance: alert; well-appearing, nontoxic; speaking in full sentences and tolerating own secretions HEENT: NCAT; Ears: EACs clear, TMs pearly gray; Eyes: PERRL.  EOM grossly intact. Nose: nares patent without rhinorrhea, Throat: oropharynx clear, tonsils non erythematous or enlarged, uvula midline  Neck: supple without LAD Lungs: unlabored respirations, symmetrical air entry; cough: absent; no respiratory distress; CTAB Heart: regular rate and rhythm.  Radial pulses 2+ symmetrical bilaterally Skin: warm and dry  Psychological: alert and cooperative; normal mood and affect  ASSESSMENT & PLAN:  1. Suspected Covid-19 Virus Infection   2. Close Exposure to Covid-19 Virus    COVID testing ordered.  It will take between 5-7 days for test results.  Someone will contact you regarding abnormal results.    In the meantime: You should remain isolated in your home for 10 days from symptom onset AND greater than 72 hours after symptoms resolution (absence of fever without the use of fever-reducing medication and improvement in respiratory symptoms), whichever is longer  Get plenty of rest and push fluids Use OTC zyrtec and/or flonase as needed for congestion and/or runny nose Use medications daily for symptom relief Use OTC medications like ibuprofen or tylenol as needed fever or pain Call or go to the ED if you have any new or worsening symptoms such as fever, worsening cough, shortness of breath, chest tightness, chest pain, turning blue, changes in mental status, etc...   Reviewed expectations re: course of current medical issues. Questions answered. Outlined signs and symptoms indicating need for more acute intervention. Patient verbalized understanding. After Visit Summary given.         Rennis Harding, PA-C 07/27/19 1810

## 2019-07-28 ENCOUNTER — Inpatient Hospital Stay (HOSPITAL_COMMUNITY): Admission: RE | Admit: 2019-07-28 | Payer: Medicaid Other | Source: Ambulatory Visit

## 2019-07-28 LAB — NOVEL CORONAVIRUS, NAA: SARS-CoV-2, NAA: NOT DETECTED

## 2019-09-13 ENCOUNTER — Telehealth: Payer: Self-pay | Admitting: Obstetrics & Gynecology

## 2019-09-13 NOTE — Telephone Encounter (Signed)

## 2019-09-14 ENCOUNTER — Ambulatory Visit: Payer: Medicaid Other | Admitting: Obstetrics & Gynecology

## 2019-09-14 ENCOUNTER — Telehealth: Payer: Self-pay | Admitting: *Deleted

## 2019-09-14 ENCOUNTER — Telehealth: Payer: Self-pay | Admitting: Obstetrics & Gynecology

## 2019-09-14 MED ORDER — TERCONAZOLE 0.4 % VA CREA: 1.0000 | TOPICAL_CREAM | Freq: Every day | VAGINAL | 0 refills | Status: DC

## 2019-09-14 NOTE — Telephone Encounter (Signed)
done

## 2019-09-14 NOTE — Telephone Encounter (Signed)
Pt had to reschedule todays visit due to not having anyone to stay with her mother. She is having trouble with yeast and wanted to see if Dr. Elonda Husky can send in the terconazole cream for her. Advised I would send message to Dr. Elonda Husky and she can check with her pharmacy later today.

## 2019-09-28 ENCOUNTER — Ambulatory Visit: Payer: Medicaid Other | Admitting: Obstetrics & Gynecology

## 2019-10-06 ENCOUNTER — Telehealth: Payer: Self-pay | Admitting: Obstetrics & Gynecology

## 2019-10-06 NOTE — Telephone Encounter (Signed)
Tried to reach the patient to remind her of her appointment/restrictions, no answer. °

## 2019-10-07 ENCOUNTER — Ambulatory Visit: Payer: Medicaid Other | Admitting: Obstetrics & Gynecology

## 2019-10-11 ENCOUNTER — Telehealth: Payer: Self-pay | Admitting: Obstetrics & Gynecology

## 2019-10-11 NOTE — Telephone Encounter (Signed)

## 2019-10-12 ENCOUNTER — Ambulatory Visit: Payer: Medicaid Other | Admitting: Obstetrics & Gynecology

## 2019-10-20 ENCOUNTER — Telehealth: Payer: Self-pay | Admitting: Obstetrics & Gynecology

## 2019-10-20 NOTE — Telephone Encounter (Signed)
Called patient regarding appointment and the following message was left:   We have you scheduled for an upcoming appointment at our office. At this time, we are still not allowing visitors or children during the appointment, however, a support person, over age 45, may accompany you to your appointment if assistance is needed for safety or care concerns. Otherwise, support persons should remain outside until the visit is complete.   We ask if you have had any exposure to anyone suspected or confirmed of having COVID-19, are awaiting test results for COVID-19 or if you are experiencing any of the following, to call and reschedule your appointment: fever, cough, shortness of breath, muscle pain, diarrhea, rash, vomiting, abdominal pain, red eye, weakness, bruising, bleeding, joint pain, or a severe headache.   Please know we will ask you these questions or similar questions when you arrive for your appointment and again it's how we are keeping everyone safe.    Also,to keep you safe, please use the provided hand sanitizer when you enter the office. We are asking everyone in the office to wear a mask to help prevent the spread of germs. If you have a mask of your own, please wear it to your appointment, if not, we are happy to provide one for you.  Thank you for understanding and your cooperation.    [facility] Staff      

## 2019-10-25 ENCOUNTER — Other Ambulatory Visit: Payer: Self-pay

## 2019-10-25 ENCOUNTER — Encounter: Payer: Self-pay | Admitting: Obstetrics & Gynecology

## 2019-10-25 ENCOUNTER — Ambulatory Visit: Payer: Medicaid Other | Admitting: Obstetrics & Gynecology

## 2019-10-25 VITALS — BP 112/75 | HR 97 | Ht 59.0 in | Wt 167.0 lb

## 2019-10-25 DIAGNOSIS — N3010 Interstitial cystitis (chronic) without hematuria: Secondary | ICD-10-CM | POA: Diagnosis not present

## 2019-10-25 MED ORDER — SILVER SULFADIAZINE 1 % EX CREA: TOPICAL_CREAM | CUTANEOUS | 11 refills | Status: DC

## 2019-10-25 NOTE — Progress Notes (Signed)
Diagnosed with IC: years ago  Current Meds:  Elmiron + DMSO Dietary restrictions    Pt states her symptoms have been stable, no exacerbations, although not perfect Wants to continue on this cycle  Blood pressure 112/75, pulse 97, height 4\' 11"  (1.499 m), weight 167 lb (75.8 kg).    The external urethra meatus was prepped with betadine DMSO 50 cc was instilled in the usual fashion after the bladder was catheterized and emptied completely 50cc was instilled into the bladder without difficulty and the patient tolerated well She will refrain from voiding as long as possible  Follow up in 8 weeks, or as patient requests based on her symptom complex

## 2019-10-28 ENCOUNTER — Other Ambulatory Visit: Payer: Self-pay | Admitting: Obstetrics & Gynecology

## 2019-10-28 ENCOUNTER — Telehealth: Payer: Self-pay | Admitting: *Deleted

## 2019-10-28 NOTE — Telephone Encounter (Signed)
Telephoned patient at home number and advised patient per Dr. Elonda Husky to keep using cream. No antibiotic is needed. Patient voiced understanding.

## 2019-10-28 NOTE — Telephone Encounter (Signed)
Patient left message stating that seen by Dr. Elonda Husky this week. She had 2nd degree burn on left breast. Has been using cream prescribed by provider and has helped some. Blister has burst but is now an open wound and thinks is now infected. Would like to know if an antibiotic could be called in.

## 2019-10-28 NOTE — Telephone Encounter (Signed)
Does not need an antibioitc just the silvadene cream, the silvadene cream is an anti microbial nothing else is needed

## 2019-11-03 ENCOUNTER — Other Ambulatory Visit: Payer: Self-pay | Admitting: Obstetrics & Gynecology

## 2019-11-04 ENCOUNTER — Telehealth: Payer: Self-pay | Admitting: Obstetrics & Gynecology

## 2019-11-04 ENCOUNTER — Telehealth: Payer: Self-pay | Admitting: *Deleted

## 2019-11-04 NOTE — Telephone Encounter (Signed)
Pt left message that she needs refill on birth control, per chart review rx sent in.

## 2019-11-04 NOTE — Telephone Encounter (Signed)
Called patient regarding appointment scheduled in our office and advised to come alone to the visit, however, a support person, over age 46, may accompany her to appointment if assistance is needed for safety or care concerns. Otherwise, support persons should remain outside until the visit is complete.   Prescreen questions asked: 1. Any of the following symptoms of COVID such as chills, fever, cough, shortness of breath, muscle pain, diarrhea, rash, vomiting, abdominal pain, red eye, weakness, bruising, bleeding, joint pain, loss of taste or smell, a severe headache, sore throat, fatigue 2. Any exposure to anyone suspected or confirmed of having COVID-19 3. Awaiting test results for COVID-19  Also,to keep you safe, please use the provided hand sanitizer when you enter the office. We are asking everyone in the office to wear a mask to help prevent the spread of germs. If you have a mask of your own, please wear it to your appointment, if not, we are happy to provide one for you.  Thank you for understanding and your cooperation.    [facility] Staff      

## 2019-11-08 ENCOUNTER — Other Ambulatory Visit: Payer: Medicaid Other | Admitting: Obstetrics & Gynecology

## 2019-11-29 ENCOUNTER — Telehealth: Payer: Self-pay | Admitting: Obstetrics & Gynecology

## 2019-11-29 NOTE — Telephone Encounter (Signed)

## 2019-11-30 ENCOUNTER — Other Ambulatory Visit: Payer: Medicaid Other | Admitting: Obstetrics & Gynecology

## 2019-12-07 ENCOUNTER — Other Ambulatory Visit: Payer: Medicaid Other | Admitting: Obstetrics & Gynecology

## 2019-12-13 ENCOUNTER — Telehealth: Payer: Self-pay | Admitting: Obstetrics & Gynecology

## 2019-12-13 NOTE — Telephone Encounter (Signed)

## 2019-12-14 ENCOUNTER — Other Ambulatory Visit: Payer: Medicaid Other | Admitting: Obstetrics & Gynecology

## 2019-12-14 ENCOUNTER — Telehealth: Payer: Self-pay | Admitting: *Deleted

## 2019-12-14 ENCOUNTER — Other Ambulatory Visit: Payer: Self-pay | Admitting: Obstetrics & Gynecology

## 2019-12-14 NOTE — Telephone Encounter (Signed)
Pt left message that her appt with Dr. Despina Hidden was cancelled today. She really needs a refill on Dilfucan and Terconazole sent to [facility].

## 2019-12-14 NOTE — Telephone Encounter (Signed)
It was sent

## 2019-12-27 ENCOUNTER — Telehealth: Payer: Self-pay | Admitting: Obstetrics & Gynecology

## 2019-12-27 NOTE — Telephone Encounter (Signed)

## 2019-12-28 ENCOUNTER — Ambulatory Visit: Payer: Medicaid Other | Admitting: Obstetrics & Gynecology

## 2019-12-30 ENCOUNTER — Telehealth: Payer: Self-pay | Admitting: Obstetrics & Gynecology

## 2019-12-30 NOTE — Telephone Encounter (Signed)

## 2019-12-31 ENCOUNTER — Other Ambulatory Visit: Payer: Self-pay

## 2019-12-31 ENCOUNTER — Other Ambulatory Visit (HOSPITAL_COMMUNITY)
Admission: RE | Admit: 2019-12-31 | Discharge: 2019-12-31 | Disposition: A | Discharge status: Home / Self Care | Payer: Medicaid Other | Source: Ambulatory Visit | Attending: Obstetrics & Gynecology | Admitting: Obstetrics & Gynecology

## 2019-12-31 ENCOUNTER — Encounter: Payer: Self-pay | Admitting: Obstetrics & Gynecology

## 2019-12-31 ENCOUNTER — Ambulatory Visit: Payer: Medicaid Other | Admitting: Obstetrics & Gynecology

## 2019-12-31 VITALS — Ht 60.0 in | Wt 168.0 lb

## 2019-12-31 DIAGNOSIS — Z0000 Encounter for general adult medical examination without abnormal findings: Secondary | ICD-10-CM | POA: Diagnosis not present

## 2019-12-31 DIAGNOSIS — Z01419 Encounter for gynecological examination (general) (routine) without abnormal findings: Secondary | ICD-10-CM

## 2019-12-31 MED ORDER — TERCONAZOLE 0.4 % VA CREA: 1.0000 | TOPICAL_CREAM | Freq: Every day | VAGINAL | 12 refills | Status: DC

## 2019-12-31 MED ORDER — FLUCONAZOLE 150 MG PO TABS: ORAL_TABLET | ORAL | 11 refills | Status: DC

## 2019-12-31 MED ORDER — LEVONORGESTREL-ETHINYL ESTRAD 0.15-30 MG-MCG PO TABS: ORAL_TABLET | ORAL | 12 refills | Status: DC

## 2019-12-31 NOTE — Progress Notes (Signed)
Subjective:     Debra Lewis is a 44 y.o. female here for a routine exam.  No LMP recorded. (Menstrual status: Irregular Periods). G1P1001 Birth Control Method:  OCP Menstrual Calendar(currently): regular  Current complaints: none.   Current acute medical issues:     Recent Gynecologic History No LMP recorded. (Menstrual status: Irregular Periods). Last Pap: 2020,  HPV Last mammogram: 2018,  normal  Past Medical History:  Diagnosis Date  . Abnormal pap   . ADHD (attention deficit hyperactivity disorder)   . Anxiety   . Asthma   . Bacterial vaginosis   . Chronic constipation   . Depression   . Diabetes mellitus without complication (HCC)   . GERD (gastroesophageal reflux disease)   . History of frequent urinary tract infections   . HSV-2 (herpes simplex virus 2) infection   . Interstitial cystitis   . Migraines   . OCD (obsessive compulsive disorder)   . Pityriasis rosea     Past Surgical History:  Procedure Laterality Date  . conization of cervix    . ESOPHAGEAL DILATION    . ORIF ELBOW FRACTURE Right 10/01/2016   Procedure: RIGHT CAPITELLUM OPEN REDUCTION INTERNAL FIXATION;  Surgeon: Betha Loa, MD;  Location: [facility];  Service: Orthopedics;  Laterality: Right;    OB History    Gravida  1   Para  1   Term  1   Preterm      AB      Living  1     SAB      TAB      Ectopic      Multiple      Live Births  1           Social History   Socioeconomic History  . Marital status: Single    Spouse name: Not on file  . Number of children: 1  . Years of education: Not on file  . Highest education level: Not on file  Occupational History  . Not on file  Tobacco Use  . Smoking status: Never Smoker  . Smokeless tobacco: Never Used  Substance and Sexual Activity  . Alcohol use: Yes    Comment: socially  . Drug use: No  . Sexual activity: Not Currently    Birth control/protection: Pill  Other Topics Concern  . Not on file   Social History Narrative  . Not on file   Social Determinants of Health   Financial Resource Strain:   . Difficulty of Paying Living Expenses: Not on file  Food Insecurity:   . Worried About Programme researcher, broadcasting/film/video in the Last Year: Not on file  . Ran Out of Food in the Last Year: Not on file  Transportation Needs:   . Lack of Transportation (Medical): Not on file  . Lack of Transportation (Non-Medical): Not on file  Physical Activity:   . Days of Exercise per Week: Not on file  . Minutes of Exercise per Session: Not on file  Stress:   . Feeling of Stress : Not on file  Social Connections:   . Frequency of Communication with Friends and Family: Not on file  . Frequency of Social Gatherings with Friends and Family: Not on file  . Attends Religious Services: Not on file  . Active Member of Clubs or Organizations: Not on file  . Attends Banker Meetings: Not on file  . Marital Status: Not on file    Family History  Problem  Relation Age of Onset  . Breast cancer Maternal Aunt   . Cancer Other   . Heart disease Other   . Diabetes Other   . Lung disease Other   . Lung cancer Sister   . Diabetes Sister   . Depression Mother   . Arthritis Mother   . Heart disease Mother   . Diabetes Mother   . Drug abuse Father   . Stroke Father   . Dementia Father   . Asthma Son      Current Outpatient Medications:  .  albuterol (PROVENTIL HFA;VENTOLIN HFA) 108 (90 BASE) MCG/ACT inhaler, Inhale 2 puffs into the lungs every 6 (six) hours as needed for wheezing. Reported on 01/17/2016, Disp: , Rfl:  .  ALPRAZolam (XANAX) 1 MG tablet, Take 1 tablet (1 mg total) by mouth 3 (three) times daily as needed for sleep or anxiety., Disp: 90 tablet, Rfl: 2 .  atorvastatin (LIPITOR) 20 MG tablet, Take 20 mg by mouth daily., Disp: , Rfl:  .  cetirizine (ZYRTEC) 10 MG tablet, Take 10 mg by mouth daily., Disp: , Rfl:  .  clobetasol cream (TEMOVATE) 0.62 %, Apply 1 application topically 2 (two)  times daily., Disp: 30 g, Rfl: 11 .  cyanocobalamin (,VITAMIN B-12,) 1000 MCG/ML injection, Inject 1,000 mcg into the muscle every 30 (thirty) days., Disp: , Rfl:  .  empagliflozin (JARDIANCE) 25 MG TABS tablet, Take by mouth., Disp: , Rfl:  .  escitalopram (LEXAPRO) 20 MG tablet, Take 20 mg by mouth daily., Disp: , Rfl:  .  fluconazole (DIFLUCAN) 150 MG tablet, USE AS DIRECTED., Disp: 2 tablet, Rfl: 11 .  hydrOXYzine (ATARAX/VISTARIL) 25 MG tablet, Take 25 mg by mouth daily as needed for itching. Reported on 05/09/2016, Disp: , Rfl:  .  ibuprofen (ADVIL,MOTRIN) 800 MG tablet, Take 800 mg by mouth daily as needed for pain (Associated with nerve damage)., Disp: , Rfl:  .  levonorgestrel-ethinyl estradiol (PORTIA-28) 0.15-30 MG-MCG tablet, TAKE (1) TABLET BY MOUTH ONCE DAILY AS DIRECTED., Disp: 28 tablet, Rfl: 12 .  omeprazole (PRILOSEC) 20 MG capsule, TAKE (1) CAPSULE BY MOUTH EVERY DAY., Disp: 30 capsule, Rfl: 11 .  pentosan polysulfate (ELMIRON) 100 MG capsule, Take 1 capsule (100 mg total) by mouth 3 (three) times daily., Disp: 90 capsule, Rfl: 11 .  RESTASIS 0.05 % ophthalmic emulsion, 1 drop 2 (two) times daily., Disp: , Rfl:  .  silver sulfADIAZINE (SILVADENE) 1 % cream, Apply to areas 2-3 times daily (Patient taking differently: as needed. Apply to areas 2-3 times daily), Disp: 50 g, Rfl: 11 .  sitaGLIPtin (JANUVIA) 100 MG tablet, Take by mouth., Disp: , Rfl:  .  SYMBICORT 80-4.5 MCG/ACT inhaler, Inhale 2 puffs into the lungs 2 (two) times daily., Disp: , Rfl:  .  terconazole (TERAZOL 7) 0.4 % vaginal cream, INSERT ONE APPLICATORFUL PER VAGINA AT BEDTIME., Disp: 45 g, Rfl: 0 .  tiZANidine (ZANAFLEX) 2 MG tablet, Take 4 mg by mouth every 6 (six) hours as needed for muscle spasms., Disp: , Rfl:  .  Urelle (URELLE/URISED) 81 MG TABS tablet, Take 1 tablet (81 mg total) by mouth 4 (four) times daily., Disp: 120 tablet, Rfl: 3 .  VIT B12-METHIONINE-INOS-CHOL IM, Inject into the muscle every 30  (thirty) days., Disp: , Rfl:  .  Vitamin D, Ergocalciferol, (DRISDOL) 1.25 MG (50000 UT) CAPS capsule, Take 50,000 Units by mouth once a week., Disp: , Rfl:  .  terconazole (TERAZOL 7) 0.4 % vaginal cream, Place 1  applicator vaginally at bedtime., Disp: 45 g, Rfl: 12  Review of Systems  Review of Systems  Constitutional: Negative for fever, chills, weight loss, malaise/fatigue and diaphoresis.  HENT: Negative for hearing loss, ear pain, nosebleeds, congestion, sore throat, neck pain, tinnitus and ear discharge.   Eyes: Negative for blurred vision, double vision, photophobia, pain, discharge and redness.  Respiratory: Negative for cough, hemoptysis, sputum production, shortness of breath, wheezing and stridor.   Cardiovascular: Negative for chest pain, palpitations, orthopnea, claudication, leg swelling and PND.  Gastrointestinal: negative for abdominal pain. Negative for heartburn, nausea, vomiting, diarrhea, constipation, blood in stool and melena.  Genitourinary: Negative for dysuria, urgency, frequency, hematuria and flank pain.  Musculoskeletal: Negative for myalgias, back pain, joint pain and falls.  Skin: Negative for itching and rash.  Neurological: Negative for dizziness, tingling, tremors, sensory change, speech change, focal weakness, seizures, loss of consciousness, weakness and headaches.  Endo/Heme/Allergies: Negative for environmental allergies and polydipsia. Does not bruise/bleed easily.  Psychiatric/Behavioral: Negative for depression, suicidal ideas, hallucinations, memory loss and substance abuse. The patient is not nervous/anxious and does not have insomnia.        Objective:  Height 5' (1.524 m), weight 168 lb (76.2 kg).   Physical Exam  Vitals reviewed. Constitutional: She is oriented to person, place, and time. She appears well-developed and well-nourished.  HENT:  Head: Normocephalic and atraumatic.        Right Ear: External ear normal.  Left Ear: External ear  normal.  Nose: Nose normal.  Mouth/Throat: Oropharynx is clear and moist.  Eyes: Conjunctivae and EOM are normal. Pupils are equal, round, and reactive to light. Right eye exhibits no discharge. Left eye exhibits no discharge. No scleral icterus.  Neck: Normal range of motion. Neck supple. No tracheal deviation present. No thyromegaly present.  Cardiovascular: Normal rate, regular rhythm, normal heart sounds and intact distal pulses.  Exam reveals no gallop and no friction rub.   No murmur heard. Respiratory: Effort normal and breath sounds normal. No respiratory distress. She has no wheezes. She has no rales. She exhibits no tenderness.  GI: Soft. Bowel sounds are normal. She exhibits no distension and no mass. There is no tenderness. There is no rebound and no guarding.  Genitourinary:  Breasts no masses skin changes or nipple changes bilaterally      Vulva is normal without lesions Vagina is pink moist without discharge Cervix normal in appearance and pap is done Uterus is normal size shape and contour Adnexa is negative with normal sized ovaries   Musculoskeletal: Normal range of motion. She exhibits no edema and no tenderness.  Neurological: She is alert and oriented to person, place, and time. She has normal reflexes. She displays normal reflexes. No cranial nerve deficit. She exhibits normal muscle tone. Coordination normal.  Skin: Skin is warm and dry. No rash noted. No erythema. No pallor.  Psychiatric: She has a normal mood and affect. Her behavior is normal. Judgment and thought content normal.       Medications Ordered at today's visit: Meds ordered this encounter  Medications  . fluconazole (DIFLUCAN) 150 MG tablet    Sig: USE AS DIRECTED.    Dispense:  2 tablet    Refill:  11  . levonorgestrel-ethinyl estradiol (PORTIA-28) 0.15-30 MG-MCG tablet    Sig: TAKE (1) TABLET BY MOUTH ONCE DAILY AS DIRECTED.    Dispense:  28 tablet    Refill:  12  . terconazole (TERAZOL 7)  0.4 % vaginal cream  Sig: Place 1 applicator vaginally at bedtime.    Dispense:  45 g    Refill:  12    Other orders placed at today's visit: No orders of the defined types were placed in this encounter.     Assessment:    Healthy female exam.    Plan:    Contraception: OCP (estrogen/progesterone). Mammogram ordered. Follow up in: 1 year.     Return in about 1 year (around 12/30/2020) for yearly, with Dr Despina Hidden.

## 2020-01-04 LAB — CYTOLOGY - PAP
Chlamydia: NEGATIVE
Comment: NEGATIVE
Comment: NEGATIVE
Comment: NORMAL
Diagnosis: HIGH — AB
High risk HPV: NEGATIVE
Neisseria Gonorrhea: NEGATIVE

## 2020-01-11 ENCOUNTER — Telehealth: Payer: Self-pay | Admitting: *Deleted

## 2020-01-11 NOTE — Telephone Encounter (Signed)
Patient notified of ASC-H and need for colpo.  All questions answered and patient scheduled.

## 2020-01-18 ENCOUNTER — Ambulatory Visit: Payer: Medicaid Other | Admitting: Obstetrics & Gynecology

## 2020-01-18 ENCOUNTER — Other Ambulatory Visit: Payer: Self-pay

## 2020-01-18 ENCOUNTER — Other Ambulatory Visit: Payer: Self-pay | Admitting: Obstetrics & Gynecology

## 2020-01-18 ENCOUNTER — Encounter: Payer: Self-pay | Admitting: Obstetrics & Gynecology

## 2020-01-18 VITALS — BP 113/81 | HR 99 | Ht 59.0 in | Wt 164.0 lb

## 2020-01-18 DIAGNOSIS — Z3202 Encounter for pregnancy test, result negative: Secondary | ICD-10-CM | POA: Diagnosis not present

## 2020-01-18 DIAGNOSIS — R87611 Atypical squamous cells cannot exclude high grade squamous intraepithelial lesion on cytologic smear of cervix (ASC-H): Secondary | ICD-10-CM

## 2020-01-18 LAB — POCT URINE PREGNANCY: Preg Test, Ur: NEGATIVE

## 2020-01-18 NOTE — Addendum Note (Signed)
Addended by: Lynnell Dike on: 01/18/2020 02:42 PM   Modules accepted: Orders

## 2020-01-18 NOTE — Progress Notes (Signed)
Colposcopy Procedure Note:  Colposcopy Procedure Note  Indications:  2021   ASC-H negative HPV 16/18/others 2020   Normal cytology/positive untyped HR HPV 2018   Normal cytology/positive untyped HR HPV 2017   Normal pap with negative HPV    2019 ASCCP recommendation:  Smoker:  No. New sexual partner:  No.  : time frame:    History of abnormal Pap: yes  Procedure Details  The risks and benefits of the procedure and Written informed consent obtained.  Speculum placed in vagina and excellent visualization of cervix achieved, cervix swabbed x 3 with acetic acid solution.  Findings: Cervix: large area involving the entire anterior cervix concerning for HSIL 2 biopsies were done; cervical biopsies taken at 2 o'clock and 10 o'clock, specimen labelled and sent to pathology and hemostasis achieved with Monsel's solution. Vaginal inspection: normal without visible lesions. Vulvar colposcopy: vulvar colposcopy not performed.  Specimens: cervical biopsies  Complications: none.  Plan(Based on 2019 ASCCP recommendations) Specimens labelled and sent to Pathology. Return to discuss Pathology results in 1 week.

## 2020-01-20 ENCOUNTER — Telehealth: Payer: Self-pay | Admitting: Obstetrics & Gynecology

## 2020-01-20 NOTE — Telephone Encounter (Signed)

## 2020-01-21 ENCOUNTER — Encounter: Payer: Self-pay | Admitting: Obstetrics & Gynecology

## 2020-01-21 ENCOUNTER — Ambulatory Visit: Payer: Medicaid Other | Admitting: Obstetrics & Gynecology

## 2020-01-21 ENCOUNTER — Other Ambulatory Visit: Payer: Self-pay

## 2020-01-21 VITALS — BP 124/84 | HR 107 | Ht 59.0 in | Wt 163.0 lb

## 2020-01-21 DIAGNOSIS — N3010 Interstitial cystitis (chronic) without hematuria: Secondary | ICD-10-CM

## 2020-01-21 NOTE — Progress Notes (Signed)
Diagnosed with IC: years  Current Meds:  Elmiron, DMSO Dietary restrictions    Pt states her symptoms have been stable, no exacerbations, although not perfect Wants to continue on this cycle  Blood pressure 124/84, pulse (!) 107, height 4\' 11"  (1.499 m), weight 163 lb (73.9 kg).    The external urethra meatus was prepped with betadine DMSO 50 cc was instilled in the usual fashion after the bladder was catheterized and emptied completely 50cc was instilled into the bladder without difficulty and the patient tolerated well She will refrain from voiding as long as possible  Follow up in 8 weeks, or as patient requests based on her symptom complex   Cervical biopsy was normal no dysplasia seen

## 2020-03-17 ENCOUNTER — Ambulatory Visit: Payer: Medicaid Other | Admitting: Obstetrics & Gynecology

## 2020-03-21 ENCOUNTER — Ambulatory Visit: Payer: Medicaid Other | Admitting: Obstetrics & Gynecology

## 2020-03-29 ENCOUNTER — Other Ambulatory Visit (HOSPITAL_COMMUNITY): Payer: Self-pay | Admitting: Internal Medicine

## 2020-03-29 ENCOUNTER — Telehealth: Payer: Self-pay | Admitting: Obstetrics & Gynecology

## 2020-03-29 DIAGNOSIS — Z1231 Encounter for screening mammogram for malignant neoplasm of breast: Secondary | ICD-10-CM

## 2020-03-29 NOTE — Telephone Encounter (Signed)

## 2020-03-30 ENCOUNTER — Ambulatory Visit: Payer: Medicaid Other | Admitting: Obstetrics & Gynecology

## 2020-04-03 ENCOUNTER — Ambulatory Visit (HOSPITAL_COMMUNITY)
Admission: RE | Admit: 2020-04-03 | Discharge: 2020-04-03 | Disposition: A | Discharge status: Home / Self Care | Payer: Medicaid Other | Source: Ambulatory Visit | Attending: Internal Medicine | Admitting: Internal Medicine

## 2020-04-03 ENCOUNTER — Encounter (HOSPITAL_COMMUNITY): Payer: Self-pay

## 2020-04-03 ENCOUNTER — Telehealth: Payer: Self-pay | Admitting: Obstetrics & Gynecology

## 2020-04-03 ENCOUNTER — Other Ambulatory Visit: Payer: Self-pay

## 2020-04-03 DIAGNOSIS — Z1231 Encounter for screening mammogram for malignant neoplasm of breast: Secondary | ICD-10-CM | POA: Insufficient documentation

## 2020-04-03 NOTE — Telephone Encounter (Signed)

## 2020-04-04 ENCOUNTER — Ambulatory Visit: Payer: Medicaid Other | Admitting: Obstetrics & Gynecology

## 2020-04-17 ENCOUNTER — Telehealth: Payer: Self-pay | Admitting: Obstetrics & Gynecology

## 2020-04-17 NOTE — Telephone Encounter (Signed)
Called patient regarding appointment and the following message was left:   Updated visitor policy: We are now allowing one support person with you during your upcoming visit.  However, we do ask that they wear a mask and will also be screened at check-in.   We ask if you are sick, have any symptoms of COVID, have had any exposure to anyone suspected or confirmed of having COVID-19, or are awaiting test results for COVID-19, to call our office as we may need to reschedule you for a virtual visit or schedule your appointment for a later date.    Please know we will ask you these questions or similar questions when you arrive for your appointment and understand this is how we are keeping everyone safe.    Also,to keep you safe, please use the provided hand sanitizer when you enter the office. We are asking everyone in the office to wear a mask to help prevent the spread of germs. If you have a mask of your own, please wear it to your appointment, if not, we are happy to provide one for you.  Thank you for understanding and your cooperation.    [facility] Staff      

## 2020-04-18 ENCOUNTER — Ambulatory Visit: Payer: Medicaid Other | Admitting: Obstetrics & Gynecology

## 2020-04-20 ENCOUNTER — Telehealth: Payer: Self-pay | Admitting: Obstetrics & Gynecology

## 2020-04-20 NOTE — Telephone Encounter (Signed)
Called patient regarding appointment and the following message was left:   Updated visitor policy: We are now allowing one support person with you during your upcoming visit.  However, we do ask that they wear a mask and will also be screened at check-in.   We ask if you are sick, have any symptoms of COVID, have had any exposure to anyone suspected or confirmed of having COVID-19, or are awaiting test results for COVID-19, to call our office as we may need to reschedule you for a virtual visit or schedule your appointment for a later date.    Please know we will ask you these questions or similar questions when you arrive for your appointment and understand this is how we are keeping everyone safe.    Also,to keep you safe, please use the provided hand sanitizer when you enter the office. We are asking everyone in the office to wear a mask to help prevent the spread of germs. If you have a mask of your own, please wear it to your appointment, if not, we are happy to provide one for you.  Thank you for understanding and your cooperation.    [facility] Staff      

## 2020-04-21 ENCOUNTER — Ambulatory Visit: Payer: Medicaid Other | Admitting: Obstetrics & Gynecology

## 2020-04-25 ENCOUNTER — Ambulatory Visit: Payer: Medicaid Other | Admitting: Obstetrics & Gynecology

## 2020-05-02 ENCOUNTER — Other Ambulatory Visit: Payer: Self-pay

## 2020-05-02 ENCOUNTER — Ambulatory Visit: Payer: Medicaid Other | Admitting: Obstetrics & Gynecology

## 2020-05-02 ENCOUNTER — Encounter: Payer: Self-pay | Admitting: Obstetrics & Gynecology

## 2020-05-02 VITALS — BP 117/82 | HR 89 | Ht 59.0 in | Wt 162.0 lb

## 2020-05-02 DIAGNOSIS — N3010 Interstitial cystitis (chronic) without hematuria: Secondary | ICD-10-CM

## 2020-05-02 MED ORDER — URELLE 81 MG PO TABS: 1.0000 | ORAL_TABLET | Freq: Four times a day (QID) | ORAL | 3 refills | Status: DC

## 2020-05-02 MED ORDER — DIMETHYL SULFOXIDE 50 % IS SOLN: 50.0000 mL | Freq: Once | INTRAVESICAL | Status: AC

## 2020-05-02 NOTE — Progress Notes (Signed)
Diagnosed with IC: >10 years  Current Meds:  Elmiron, urelle, DMSO Dietary restrictions    Pt states her symptoms have been stable, no exacerbations, although not perfect Wants to continue on this cycle  Blood pressure 117/82, pulse 89, height 4\' 11"  (1.499 m), weight 162 lb (73.5 kg).    The external urethra meatus was prepped with betadine DMSO 50 cc was instilled in the usual fashion after the bladder was catheterized and emptied completely 50cc was instilled into the bladder without difficulty and the patient tolerated well She will refrain from voiding as long as possible  Follow up in 8 weeks, or as patient requests based on her symptom complex

## 2020-06-01 ENCOUNTER — Ambulatory Visit (INDEPENDENT_AMBULATORY_CARE_PROVIDER_SITE_OTHER): Payer: Medicaid Other | Admitting: Pediatrics

## 2020-06-01 ENCOUNTER — Other Ambulatory Visit: Payer: Self-pay

## 2020-06-01 DIAGNOSIS — Z23 Encounter for immunization: Secondary | ICD-10-CM | POA: Diagnosis not present

## 2020-06-27 ENCOUNTER — Ambulatory Visit: Payer: Medicaid Other | Admitting: Obstetrics & Gynecology

## 2020-06-28 ENCOUNTER — Other Ambulatory Visit: Payer: Self-pay

## 2020-06-28 ENCOUNTER — Ambulatory Visit (INDEPENDENT_AMBULATORY_CARE_PROVIDER_SITE_OTHER): Payer: Medicaid Other

## 2020-06-28 ENCOUNTER — Ambulatory Visit: Payer: Medicaid Other

## 2020-06-28 DIAGNOSIS — Z23 Encounter for immunization: Secondary | ICD-10-CM

## 2020-07-04 ENCOUNTER — Other Ambulatory Visit: Payer: Self-pay | Admitting: Obstetrics & Gynecology

## 2020-07-04 ENCOUNTER — Ambulatory Visit: Payer: Medicaid Other | Admitting: Obstetrics & Gynecology

## 2020-07-11 ENCOUNTER — Ambulatory Visit: Payer: Medicaid Other | Admitting: Obstetrics & Gynecology

## 2020-07-27 ENCOUNTER — Ambulatory Visit: Payer: Medicaid Other | Admitting: Obstetrics & Gynecology

## 2020-08-08 ENCOUNTER — Encounter: Payer: Self-pay | Admitting: Obstetrics & Gynecology

## 2020-08-08 ENCOUNTER — Ambulatory Visit: Payer: Medicaid Other | Admitting: Obstetrics & Gynecology

## 2020-08-08 VITALS — BP 116/80 | HR 96 | Ht 59.0 in | Wt 155.8 lb

## 2020-08-08 DIAGNOSIS — N3010 Interstitial cystitis (chronic) without hematuria: Secondary | ICD-10-CM

## 2020-08-08 NOTE — Progress Notes (Signed)
Diagnosed with IC: years ago  Current Meds:  Elmorin, DMSO Dietary restrictions    Pt states her symptoms have been stable, no exacerbations, although not perfect Wants to continue on this cycle  Blood pressure 116/80, pulse 96, height 4\' 11"  (1.499 m), weight 155 lb 12.8 oz (70.7 kg).    The external urethra meatus was prepped with betadine DMSO 50 cc was instilled in the usual fashion after the bladder was catheterized and emptied completely 50cc was instilled into the bladder without difficulty and the patient tolerated well She will refrain from voiding as long as possible  Follow up in 8 weeks, or as patient requests based on her symptom complex

## 2020-08-15 ENCOUNTER — Other Ambulatory Visit: Payer: Self-pay | Admitting: Obstetrics & Gynecology

## 2020-09-12 ENCOUNTER — Emergency Department (HOSPITAL_COMMUNITY): Payer: Medicaid Other

## 2020-09-12 ENCOUNTER — Encounter (HOSPITAL_COMMUNITY): Payer: Self-pay | Admitting: Emergency Medicine

## 2020-09-12 ENCOUNTER — Other Ambulatory Visit: Payer: Self-pay

## 2020-09-12 ENCOUNTER — Emergency Department (HOSPITAL_COMMUNITY)
Admission: EM | Admit: 2020-09-12 | Discharge: 2020-09-12 | Disposition: A | Discharge status: Home / Self Care | Payer: Medicaid Other | Attending: Emergency Medicine | Admitting: Emergency Medicine

## 2020-09-12 DIAGNOSIS — Z91040 Latex allergy status: Secondary | ICD-10-CM | POA: Insufficient documentation

## 2020-09-12 DIAGNOSIS — R55 Syncope and collapse: Secondary | ICD-10-CM | POA: Insufficient documentation

## 2020-09-12 DIAGNOSIS — J45909 Unspecified asthma, uncomplicated: Secondary | ICD-10-CM | POA: Insufficient documentation

## 2020-09-12 DIAGNOSIS — R110 Nausea: Secondary | ICD-10-CM | POA: Insufficient documentation

## 2020-09-12 DIAGNOSIS — S0990XA Unspecified injury of head, initial encounter: Secondary | ICD-10-CM | POA: Diagnosis not present

## 2020-09-12 DIAGNOSIS — S80211A Abrasion, right knee, initial encounter: Secondary | ICD-10-CM | POA: Insufficient documentation

## 2020-09-12 DIAGNOSIS — X58XXXA Exposure to other specified factors, initial encounter: Secondary | ICD-10-CM | POA: Insufficient documentation

## 2020-09-12 DIAGNOSIS — S90511A Abrasion, right ankle, initial encounter: Secondary | ICD-10-CM | POA: Insufficient documentation

## 2020-09-12 DIAGNOSIS — E119 Type 2 diabetes mellitus without complications: Secondary | ICD-10-CM | POA: Insufficient documentation

## 2020-09-12 LAB — BASIC METABOLIC PANEL
Anion gap: 9 (ref 5–15)
BUN: 10 mg/dL (ref 6–20)
CO2: 22 mmol/L (ref 22–32)
Calcium: 8.6 mg/dL — ABNORMAL LOW (ref 8.9–10.3)
Chloride: 105 mmol/L (ref 98–111)
Creatinine, Ser: 0.96 mg/dL (ref 0.44–1.00)
GFR, Estimated: >60 mL/min (ref >60)
Glucose, Bld: 105 mg/dL — ABNORMAL HIGH (ref 70–99)
Potassium: 3.5 mmol/L (ref 3.5–5.1)
Sodium: 136 mmol/L (ref 135–145)

## 2020-09-12 LAB — CBC
HCT: 47.1 % — ABNORMAL HIGH (ref 36.0–46.0)
Hemoglobin: 14.8 g/dL (ref 12.0–15.0)
MCH: 26.2 pg (ref 26.0–34.0)
MCHC: 31.4 g/dL (ref 30.0–36.0)
MCV: 83.4 fL (ref 80.0–100.0)
Platelets: 487 10*3/uL — ABNORMAL HIGH (ref 150–400)
RBC: 5.65 MIL/uL — ABNORMAL HIGH (ref 3.87–5.11)
RDW: 15.2 % (ref 11.5–15.5)
WBC: 8.6 10*3/uL (ref 4.0–10.5)
nRBC: 0 % (ref 0.0–0.2)

## 2020-09-12 LAB — URINALYSIS, ROUTINE W REFLEX MICROSCOPIC
Bilirubin Urine: NEGATIVE
Glucose, UA: >=500 mg/dL — AB
Hgb urine dipstick: NEGATIVE
Ketones, ur: NEGATIVE mg/dL
Leukocytes,Ua: NEGATIVE
Nitrite: NEGATIVE
Protein, ur: NEGATIVE mg/dL
Specific Gravity, Urine: 1.036 — ABNORMAL HIGH (ref 1.005–1.030)
pH: 6 (ref 5.0–8.0)

## 2020-09-12 LAB — CBG MONITORING, ED: Glucose-Capillary: 99 mg/dL (ref 70–99)

## 2020-09-12 LAB — POC URINE PREG, ED: Preg Test, Ur: NEGATIVE

## 2020-09-12 MED ORDER — ACETAMINOPHEN 500 MG PO TABS
1000.0000 mg | ORAL_TABLET | Freq: Once | ORAL | Status: AC
  Administered 2020-09-12: 1000 mg via ORAL
  Filled 2020-09-12: qty 2

## 2020-09-12 MED ORDER — BACITRACIN-NEOMYCIN-POLYMYXIN 400-5-5000 EX OINT
TOPICAL_OINTMENT | Freq: Once | CUTANEOUS | Status: AC
  Administered 2020-09-12: 1 via TOPICAL
  Filled 2020-09-12: qty 1

## 2020-09-12 NOTE — ED Triage Notes (Signed)
Pt states she passed out and hit her head. Hematoma to the forehead is visible. Pt is also complaining of rt ankle pain.

## 2020-09-12 NOTE — Discharge Instructions (Addendum)
Your CT scan, exam and laboratory tests are reassuring today.  Make sure you are consuming calories and are drinking plenty of fluids.  Rest.  Plan to see your MD Dr. Margo Aye for a recheck in one week.  Return here sooner for a recheck if you develop any new or worsened symptoms.

## 2020-09-12 NOTE — ED Provider Notes (Signed)
[facility] EMERGENCY DEPARTMENT Provider Note   CSN: [id number] Arrival date & time: 09/12/20  1150     History Chief Complaint  Patient presents with  . Loss of Consciousness    Debra Lewis is a 44 y.o. female with a history of type 2 diabetes, GERD, interstitial cystitis, asthma, anxiety and history of migraine headaches describing having a mild typical migraine headache yesterday for which she took Tylenol and felt improved but headache was not completely resolved.  This morning she was standing at her front door talking to a delivery person when she started to feel nauseated and had a syncopal event.  She had been standing for approximately 10 minutes in conversation prior to onset of symptoms.  She did not vomit and does not feel nauseated at this time.  She fell forward through the screen door and woke up on her front porch with the delivery driver assisting her.  She has sustained a injury to her forehead, also has abrasions on her right ankle and right knee, no other complaints of pain.  She did not have any chest pain, shortness of breath, palpitations or other symptoms prior to the syncopal event.  She does endorse poor p.o. intake over the past 48 hours secondary to reduced appetite, denies nausea, vomiting or abdominal pain prior to onset of symptoms this morning.  The history is provided by the patient.       Past Medical History:  Diagnosis Date  . Abnormal pap   . ADHD (attention deficit hyperactivity disorder)   . Anxiety   . Asthma   . Bacterial vaginosis   . Chronic constipation   . Depression   . Diabetes mellitus without complication (HCC)   . GERD (gastroesophageal reflux disease)   . History of frequent urinary tract infections   . HSV-2 (herpes simplex virus 2) infection   . Interstitial cystitis   . Migraines   . OCD (obsessive compulsive disorder)   . Pityriasis rosea     Patient Active Problem List   Diagnosis Date Noted  . Urinary tract  infection, site not specified 04/16/2013  . Chronic interstitial cystitis 03/03/2013  . 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 WRIST PAIN, BILATERAL 12/26/2009  . KNEE SPRAIN 12/26/2009    Past Surgical History:  Procedure Laterality Date  . conization of cervix    . ESOPHAGEAL DILATION    . ORIF ELBOW FRACTURE Right 10/01/2016   Procedure: RIGHT CAPITELLUM OPEN REDUCTION INTERNAL FIXATION;  Surgeon: Betha Loa, MD;  Location: [facility];  Service: Orthopedics;  Laterality: Right;     OB History    Gravida  1   Para  1   Term  1   Preterm      AB      Living  1     SAB      TAB      Ectopic      Multiple      Live Births  1           Family History  Problem Relation Age of Onset  . Breast cancer Maternal Aunt   . Cancer Other   . Heart disease Other   . Diabetes Other   . Lung disease Other   . Lung cancer Sister   . Diabetes Sister   . Depression Mother   . Arthritis Mother   . Heart disease Mother   . Diabetes Mother   . Drug abuse Father   . Stroke  Father   . Dementia Father   . Asthma Son   . Breast cancer Paternal Aunt     Social History   Tobacco Use  . Smoking status: Never Smoker  . Smokeless tobacco: Never Used  Vaping Use  . Vaping Use: Never used  Substance Use Topics  . Alcohol use: Yes    Comment: socially  . Drug use: No    Home Medications Prior to Admission medications   Medication Sig Start Date End Date Taking? Authorizing Provider  albuterol (PROVENTIL HFA;VENTOLIN HFA) 108 (90 BASE) MCG/ACT inhaler Inhale 2 puffs into the lungs every 6 (six) hours as needed for wheezing. Reported on 01/17/2016 Patient not taking: Reported on 08/08/2020    [provider]  ALPRAZolam Prudy Feeler) 1 MG tablet Take 1 tablet (1 mg total) by mouth 3 (three) times daily as needed for sleep or anxiety. 03/30/15   Myrlene Broker, MD  atorvastatin (LIPITOR) 20 MG tablet Take 40 mg by mouth daily.     [provider]  cetirizine (ZYRTEC) 10 MG tablet Take 10 mg by mouth daily.    [provider]  clobetasol cream (TEMOVATE) 0.05 % APPLY TO AFFECTED AREAS TWICE DAILY. 08/16/20   Lazaro Arms, MD  clobetasol cream (TEMOVATE) 0.05 % APPLY TO AFFECTED AREAS TWICE DAILY. 08/16/20   Lazaro Arms, MD  cyanocobalamin (,VITAMIN B-12,) 1000 MCG/ML injection Inject 1,000 mcg into the muscle every 30 (thirty) days. 09/02/19   [provider]  diphenhydrAMINE HCl (BENADRYL ALLERGY PO) Take by mouth as needed.    [provider]  empagliflozin (JARDIANCE) 25 MG TABS tablet Take by mouth.    [provider]  escitalopram (LEXAPRO) 20 MG tablet Take 20 mg by mouth daily.    [provider]  fluconazole (DIFLUCAN) 150 MG tablet USE AS DIRECTED. 12/31/19   Lazaro Arms, MD  hydrOXYzine (ATARAX/VISTARIL) 25 MG tablet Take 25 mg by mouth daily as needed for itching. Reported on 05/09/2016    [provider]  ibuprofen (ADVIL,MOTRIN) 800 MG tablet Take 800 mg by mouth daily as needed for pain (Associated with nerve damage).    [provider]  levonorgestrel-ethinyl estradiol (PORTIA-28) 0.15-30 MG-MCG tablet TAKE (1) TABLET BY MOUTH ONCE DAILY AS DIRECTED. 12/31/19   Lazaro Arms, MD  omeprazole (PRILOSEC) 20 MG capsule TAKE (1) CAPSULE BY MOUTH EVERY DAY. 07/04/20   Lazaro Arms, MD  pentosan polysulfate (ELMIRON) 100 MG capsule Take 1 capsule (100 mg total) by mouth 3 (three) times daily. Patient taking differently: Take 100 mg by mouth.  06/29/19   Lazaro Arms, MD  RESTASIS 0.05 % ophthalmic emulsion 1 drop 2 (two) times daily. 06/02/19   [provider]  silver sulfADIAZINE (SILVADENE) 1 % cream Apply to areas 2-3 times daily Patient taking differently: as needed. Apply to areas 2-3 times daily 10/25/19   Lazaro Arms, MD  SYMBICORT 80-4.5 MCG/ACT inhaler Inhale 2 puffs into the lungs 2 (two) times daily. 06/02/19   [provider]    terconazole (TERAZOL 7) 0.4 % vaginal cream INSERT ONE APPLICATORFUL PER VAGINA AT BEDTIME. 12/14/19   Lazaro Arms, MD  tiZANidine (ZANAFLEX) 2 MG tablet Take 4 mg by mouth every 6 (six) hours as needed for muscle spasms.    [provider]  triamcinolone cream (KENALOG) 0.5 % APPLY TO AFFECTED AREA(s) ONCE DAILY AS NEEDED FOR IRRITATION. 08/16/20   Lazaro Arms, MD  Urelle (URELLE/URISED) [phone]  MG TABS tablet Take 1 tablet (81 mg total) by mouth 4 (four) times daily. 05/02/20   Lazaro Arms, MD  Vitamin D, Ergocalciferol, (DRISDOL) 1.25 MG (50000 UT) CAPS capsule Take 50,000 Units by mouth once a week. 10/18/19   [provider]    Allergies    Latex, Penicillins, Codeine, Flagyl [metronidazole hcl], and Sulfonamide derivatives  Review of Systems   Review of Systems  Physical Exam Updated Vital Signs BP 104/68 (BP Location: Left Arm)   Pulse 88   Temp 97.8 F (36.6 C) (Oral)   Resp 16   Ht 4\' 11"  (1.499 m)   Wt 70.3 kg   SpO2 100%   BMI 31.31 kg/m   Physical Exam  ED Results / Procedures / Treatments   Labs (all labs ordered are listed, but only abnormal results are displayed) Labs Reviewed  BASIC METABOLIC PANEL - Abnormal; Notable for the following components:      Result Value   Glucose, Bld 105 (*)    Calcium 8.6 (*)    All other components within normal limits  CBC - Abnormal; Notable for the following components:   RBC 5.65 (*)    HCT 47.1 (*)    Platelets 487 (*)    All other components within normal limits  URINALYSIS, ROUTINE W REFLEX MICROSCOPIC - Abnormal; Notable for the following components:   Color, Urine GREEN (*)    APPearance HAZY (*)    Specific Gravity, Urine 1.036 (*)    Glucose, UA >=500 (*)    Bacteria, UA RARE (*)    All other components within normal limits  CBG MONITORING, ED  POC URINE PREG, ED    EKG EKG Interpretation  Date/Time:  Tuesday September 12 2020 12:02:29 EST Ventricular Rate:  90 PR  Interval:  148 QRS Duration: 72 QT Interval:  346 QTC Calculation: 423 R Axis:   73 Text Interpretation: Normal sinus rhythm Normal ECG Confirmed by [id number] [phone]) on 09/12/2020 3:23:53 PM   Radiology CT Head Wo Contrast  Result Date: 09/12/2020 CLINICAL DATA:  Syncope, head injury EXAM: CT HEAD WITHOUT CONTRAST TECHNIQUE: Contiguous axial images were obtained from the base of the skull through the vertex without intravenous contrast. COMPARISON:  None. FINDINGS: Brain: There is no acute intracranial hemorrhage, mass effect, or edema. Gray-white differentiation is preserved. There is no extra-axial fluid collection. Ventricles and sulci are within normal limits in size and configuration. Vascular: No hyperdense vessel or unexpected calcification. Skull: Calvarium is unremarkable. Sinuses/Orbits: No acute finding. Other: Right anterior frontal scalp soft tissue swelling. IMPRESSION: No evidence acute intracranial injury. Electronically Signed   By: 09/14/2020 M.D.   On: 09/12/2020 14:33    Procedures Procedures (including critical care time)  Medications Ordered in ED Medications  neomycin-bacitracin-polymyxin (NEOSPORIN) ointment packet (1 application Topical Given 09/12/20 1402)  acetaminophen (TYLENOL) tablet 1,000 mg (1,000 mg Oral Given 09/12/20 1402)    ED Course  I have reviewed the triage vital signs and the nursing notes.  Pertinent labs & imaging results that were available during my care of the patient were reviewed by me and considered in my medical decision making (see chart for details).    MDM Rules/Calculators/A&P                          Pt with syncopal event of unclear etiology, labs reviewed without sig findings, hgb normal, electrolytes normal, she has chronic interstitial cystitis, currently on urelle  which explains urinary discoloration.    Ct imaging negative for intracranial abnormality or injury.  Moderate frontal hematoma treated with ice  packs, ibuprofen.  Abrasions are small and hemostatic, right anterior knee and anterior ankle.  Dressing, wound care provided. Planned rest, increased calories/po intake.  Plan f/u with pcp within 1 week.  No cp, normal ekg.  No ectopy on monitor during ed stay, doubt cardiac source.  Final Clinical Impression(s) / ED Diagnoses Final diagnoses:  Vasovagal syncope    Rx / DC Orders ED Discharge Orders    None       Victoriano Laindol, Surena Welge, PA-C 09/12/20 1537    Bethann BerkshireZammit, Joseph, MD 09/15/20 [phone]

## 2020-10-03 ENCOUNTER — Ambulatory Visit: Payer: Medicaid Other | Admitting: Obstetrics & Gynecology

## 2020-10-10 ENCOUNTER — Ambulatory Visit: Payer: Medicaid Other | Admitting: Obstetrics & Gynecology

## 2020-10-24 ENCOUNTER — Ambulatory Visit: Payer: Medicaid Other | Admitting: Obstetrics & Gynecology

## 2020-11-09 ENCOUNTER — Ambulatory Visit: Payer: Medicaid Other | Admitting: Obstetrics & Gynecology

## 2020-11-09 ENCOUNTER — Other Ambulatory Visit: Payer: Self-pay

## 2020-11-09 ENCOUNTER — Encounter: Payer: Self-pay | Admitting: Obstetrics & Gynecology

## 2020-11-09 VITALS — BP 128/82 | HR 114 | Ht 59.0 in | Wt 144.0 lb

## 2020-11-09 DIAGNOSIS — N3010 Interstitial cystitis (chronic) without hematuria: Secondary | ICD-10-CM

## 2020-11-09 NOTE — Progress Notes (Signed)
Diagnosed with IC: years and years ago  Current Meds:  DMSO Dietary restrictions    Pt states her symptoms have been stable, no exacerbations, although not perfect Wants to continue on this cycle  Blood pressure 128/82, pulse (!) 114, height 4\' 11"  (1.499 m), weight 144 lb (65.3 kg).    The external urethra meatus was prepped with betadine DMSO 50 cc was instilled in the usual fashion after the bladder was catheterized and emptied completely 50cc was instilled into the bladder without difficulty and the patient tolerated well She will refrain from voiding as long as possible  Follow up in 12 weeks, or as patient requests based on her symptom complex

## 2020-12-28 ENCOUNTER — Other Ambulatory Visit: Payer: Self-pay | Admitting: Obstetrics & Gynecology

## 2021-01-02 ENCOUNTER — Other Ambulatory Visit (HOSPITAL_COMMUNITY)
Admission: RE | Admit: 2021-01-02 | Discharge: 2021-01-02 | Disposition: A | Discharge status: Home / Self Care | Payer: Medicaid Other | Source: Ambulatory Visit | Attending: Obstetrics & Gynecology | Admitting: Obstetrics & Gynecology

## 2021-01-02 ENCOUNTER — Ambulatory Visit: Payer: Medicaid Other | Admitting: Obstetrics & Gynecology

## 2021-01-02 ENCOUNTER — Encounter: Payer: Self-pay | Admitting: Obstetrics & Gynecology

## 2021-01-02 ENCOUNTER — Other Ambulatory Visit: Payer: Self-pay

## 2021-01-02 VITALS — BP 108/82 | HR 116 | Ht 59.0 in | Wt 139.0 lb

## 2021-01-02 DIAGNOSIS — Z01419 Encounter for gynecological examination (general) (routine) without abnormal findings: Secondary | ICD-10-CM

## 2021-01-02 NOTE — Progress Notes (Signed)
Subjective:     Debra Lewis is a 45 y.o. female here for a routine exam.  No LMP recorded. (Menstrual status: Oral contraceptives). G1P1001 Birth Control Method:  COC Menstrual Calendar(currently): amenorrheic  Current complaints: none.   Current acute medical issues:  none   Recent Gynecologic History No LMP recorded. (Menstrual status: Oral contraceptives). Last Pap: 12/31/19,  [facility] Last mammogram: 04/03/20,  normal  Past Medical History:  Diagnosis Date  . Abnormal pap   . ADHD (attention deficit hyperactivity disorder)   . Anxiety   . Asthma   . Bacterial vaginosis   . Chronic constipation   . Depression   . Diabetes mellitus without complication (HCC)   . GERD (gastroesophageal reflux disease)   . History of frequent urinary tract infections   . HSV-2 (herpes simplex virus 2) infection   . Interstitial cystitis   . Migraines   . OCD (obsessive compulsive disorder)   . Pityriasis rosea     Past Surgical History:  Procedure Laterality Date  . conization of cervix    . ESOPHAGEAL DILATION    . ORIF ELBOW FRACTURE Right 10/01/2016   Procedure: RIGHT CAPITELLUM OPEN REDUCTION INTERNAL FIXATION;  Surgeon: Betha Loa, MD;  Location: [facility];  Service: Orthopedics;  Laterality: Right;    OB History    Gravida  1   Para  1   Term  1   Preterm      AB      Living  1     SAB      IAB      Ectopic      Multiple      Live Births  1           Social History   Socioeconomic History  . Marital status: Single    Spouse name: Not on file  . Number of children: 1  . Years of education: Not on file  . Highest education level: Not on file  Occupational History  . Not on file  Tobacco Use  . Smoking status: Never Smoker  . Smokeless tobacco: Never Used  Vaping Use  . Vaping Use: Never used  Substance and Sexual Activity  . Alcohol use: Yes    Comment: socially  . Drug use: No  . Sexual activity: Yes    Birth  control/protection: Pill  Other Topics Concern  . Not on file  Social History Narrative  . Not on file   Social Determinants of Health   Financial Resource Strain: Low Risk   . Difficulty of Paying Living Expenses: Not very hard  Food Insecurity: No Food Insecurity  . Worried About Programme researcher, broadcasting/film/video in the Last Year: Never true  . Ran Out of Food in the Last Year: Never true  Transportation Needs: No Transportation Needs  . Lack of Transportation (Medical): No  . Lack of Transportation (Non-Medical): No  Physical Activity: Insufficiently Active  . Days of Exercise per Week: 3 days  . Minutes of Exercise per Session: 40 min  Stress: Stress Concern Present  . Feeling of Stress : Very much  Social Connections: Socially Isolated  . Frequency of Communication with Friends and Family: Once a week  . Frequency of Social Gatherings with Friends and Family: Once a week  . Attends Religious Services: More than 4 times per year  . Active Member of Clubs or Organizations: No  . Attends Banker Meetings: Never  . Marital Status: Never married  Family History  Problem Relation Age of Onset  . Breast cancer Maternal Aunt   . Cancer Other   . Heart disease Other   . Diabetes Other   . Lung disease Other   . Lung cancer Sister   . Diabetes Sister   . Depression Mother   . Arthritis Mother   . Heart disease Mother   . Diabetes Mother   . Drug abuse Father   . Stroke Father   . Dementia Father   . Asthma Son   . Breast cancer Paternal Aunt      Current Outpatient Medications:  .  albuterol (PROVENTIL HFA;VENTOLIN HFA) 108 (90 BASE) MCG/ACT inhaler, Inhale 2 puffs into the lungs every 6 (six) hours as needed for wheezing. Reported on 01/17/2016, Disp: , Rfl:  .  ALPRAZolam (XANAX) 1 MG tablet, Take 1 tablet (1 mg total) by mouth 3 (three) times daily as needed for sleep or anxiety., Disp: 90 tablet, Rfl: 2 .  atorvastatin (LIPITOR) 40 MG tablet, TAKE 1 TABLET BY  MOUTHIONCE A DAY., Disp: , Rfl:  .  cetirizine (ZYRTEC) 10 MG tablet, Take 10 mg by mouth daily., Disp: , Rfl:  .  clindamycin (CLINDAGEL) 1 % gel, SMARTSIG:Sparingly Topical Twice Daily, Disp: , Rfl:  .  Clobetasol Prop Emollient Base 0.05 % emollient cream, SMARTSIG:1 Topical Every Night, Disp: , Rfl:  .  cyanocobalamin (,VITAMIN B-12,) 1000 MCG/ML injection, Inject 1,000 mcg into the muscle every 30 (thirty) days., Disp: , Rfl:  .  diphenhydrAMINE HCl (BENADRYL ALLERGY PO), Take by mouth as needed., Disp: , Rfl:  .  empagliflozin (JARDIANCE) 25 MG TABS tablet, Take by mouth., Disp: , Rfl:  .  escitalopram (LEXAPRO) 10 MG tablet, Take 10 mg by mouth daily., Disp: , Rfl:  .  fluconazole (DIFLUCAN) 150 MG tablet, USE AS DIRECTED., Disp: 2 tablet, Rfl: 11 .  hydrOXYzine (ATARAX/VISTARIL) 25 MG tablet, Take 25 mg by mouth daily as needed for itching. Reported on 05/09/2016, Disp: , Rfl:  .  ibuprofen (ADVIL,MOTRIN) 800 MG tablet, Take 800 mg by mouth daily as needed for pain (Associated with nerve damage)., Disp: , Rfl:  .  levonorgestrel-ethinyl estradiol (PORTIA-28) 0.15-30 MG-MCG tablet, TAKE (1) TABLET BY MOUTH ONCE DAILY AS DIRECTED., Disp: 28 tablet, Rfl: 12 .  omeprazole (PRILOSEC) 20 MG capsule, TAKE (1) CAPSULE BY MOUTH EVERY DAY., Disp: 30 capsule, Rfl: 11 .  pentosan polysulfate (ELMIRON) 100 MG capsule, Take 1 capsule (100 mg total) by mouth 3 (three) times daily. (Patient taking differently: Take 100 mg by mouth.), Disp: 90 capsule, Rfl: 11 .  RESTASIS 0.05 % ophthalmic emulsion, 1 drop 2 (two) times daily., Disp: , Rfl:  .  silver sulfADIAZINE (SILVADENE) 1 % cream, Apply to areas 2-3 times daily (Patient taking differently: as needed. Apply to areas 2-3 times daily), Disp: 50 g, Rfl: 11 .  SYMBICORT 80-4.5 MCG/ACT inhaler, Inhale 2 puffs into the lungs 2 (two) times daily., Disp: , Rfl:  .  terconazole (TERAZOL 7) 0.4 % vaginal cream, INSERT ONE APPLICATORFUL PER VAGINA AT BEDTIME.,  Disp: 45 g, Rfl: 0 .  tiZANidine (ZANAFLEX) 2 MG tablet, Take 4 mg by mouth every 6 (six) hours as needed for muscle spasms., Disp: , Rfl:  .  triamcinolone cream (KENALOG) 0.5 %, APPLY TO AFFECTED AREA(s) ONCE DAILY AS NEEDED FOR IRRITATION., Disp: 30 g, Rfl: 11 .  TRULICITY 1.5 MG/0.5ML SOPN, SMARTSIG:0.5 Milliliter(s) SUB-Q Once a Week, Disp: , Rfl:  .  Urelle (URELLE/URISED)  81 MG TABS tablet, Take 1 tablet (81 mg total) by mouth 4 (four) times daily., Disp: 120 tablet, Rfl: 3 .  Vitamin D, Ergocalciferol, (DRISDOL) 1.25 MG (50000 UT) CAPS capsule, Take 50,000 Units by mouth once a week., Disp: , Rfl:   Current Facility-Administered Medications:  .  dimethyl sulfoxide 50 % solution 50 mL, 50 mL, Urethral, Once, Lazaro Arms, MD  Review of Systems  Review of Systems  Constitutional: Negative for fever, chills, weight loss, malaise/fatigue and diaphoresis.  HENT: Negative for hearing loss, ear pain, nosebleeds, congestion, sore throat, neck pain, tinnitus and ear discharge.   Eyes: Negative for blurred vision, double vision, photophobia, pain, discharge and redness.  Respiratory: Negative for cough, hemoptysis, sputum production, shortness of breath, wheezing and stridor.   Cardiovascular: Negative for chest pain, palpitations, orthopnea, claudication, leg swelling and PND.  Gastrointestinal: negative for abdominal pain. Negative for heartburn, nausea, vomiting, diarrhea, constipation, blood in stool and melena.  Genitourinary: Negative for dysuria, urgency, frequency, hematuria and flank pain.  Musculoskeletal: Negative for myalgias, back pain, joint pain and falls.  Skin: Negative for itching and rash.  Neurological: Negative for dizziness, tingling, tremors, sensory change, speech change, focal weakness, seizures, loss of consciousness, weakness and headaches.  Endo/Heme/Allergies: Negative for environmental allergies and polydipsia. Does not bruise/bleed easily.   Psychiatric/Behavioral: Negative for depression, suicidal ideas, hallucinations, memory loss and substance abuse. The patient is not nervous/anxious and does not have insomnia.        Objective:  Blood pressure 108/82, pulse (!) 116, height 4\' 11"  (1.499 m), weight 139 lb (63 kg).   Physical Exam  Vitals reviewed. Constitutional: She is oriented to person, place, and time. She appears well-developed and well-nourished.  HENT:  Head: Normocephalic and atraumatic.        Right Ear: External ear normal.  Left Ear: External ear normal.  Nose: Nose normal.  Mouth/Throat: Oropharynx is clear and moist.  Eyes: Conjunctivae and EOM are normal. Pupils are equal, round, and reactive to light. Right eye exhibits no discharge. Left eye exhibits no discharge. No scleral icterus.  Neck: Normal range of motion. Neck supple. No tracheal deviation present. No thyromegaly present.  Cardiovascular: Normal rate, regular rhythm, normal heart sounds and intact distal pulses.  Exam reveals no gallop and no friction rub.   No murmur heard. Respiratory: Effort normal and breath sounds normal. No respiratory distress. She has no wheezes. She has no rales. She exhibits no tenderness.  GI: Soft. Bowel sounds are normal. She exhibits no distension and no mass. There is no tenderness. There is no rebound and no guarding.  Genitourinary:  Breasts no masses skin changes or nipple changes bilaterally      Vulva is normal without lesions Vagina is pink moist without discharge Cervix normal in appearance and pap is done Uterus is normal size shape and contour Adnexa is negative with normal sized ovaries   Musculoskeletal: Normal range of motion. She exhibits no edema and no tenderness.  Neurological: She is alert and oriented to person, place, and time. She has normal reflexes. She displays normal reflexes. No cranial nerve deficit. She exhibits normal muscle tone. Coordination normal.  Skin: Skin is warm and dry. No  rash noted. No erythema. No pallor.  Psychiatric: She has a normal mood and affect. Her behavior is normal. Judgment and thought content normal.       Medications Ordered at today's visit: No orders of the defined types were placed in this encounter.  Other orders placed at today's visit: No orders of the defined types were placed in this encounter.     Assessment:    Normal Gyn exam.    Plan:    Contraception: OCP (estrogen/progesterone). Mammogram ordered. Follow up in: 1 years.     No follow-ups on file.

## 2021-01-08 LAB — CYTOLOGY - PAP
Chlamydia: NEGATIVE
Comment: NEGATIVE
Comment: NEGATIVE
Comment: NORMAL
Diagnosis: NEGATIVE
High risk HPV: NEGATIVE
Neisseria Gonorrhea: NEGATIVE

## 2021-01-11 ENCOUNTER — Other Ambulatory Visit: Payer: Self-pay | Admitting: Obstetrics & Gynecology

## 2021-01-16 ENCOUNTER — Telehealth: Payer: Self-pay | Admitting: Obstetrics & Gynecology

## 2021-01-16 NOTE — Telephone Encounter (Signed)
Patient states she normally gets 12 refills on the Diflucan, Terconazole and Clobetasol but on 2 were sent in.  She is requesting 12 refills on each.

## 2021-01-16 NOTE — Telephone Encounter (Signed)
PT says the last 2 refills that were called in for her typically get a year's worth of refills and there were none this time. Would like to know if MD can add more refills for meds.

## 2021-01-17 ENCOUNTER — Other Ambulatory Visit: Payer: Self-pay | Admitting: Obstetrics & Gynecology

## 2021-01-17 MED ORDER — TRIAMCINOLONE ACETONIDE 0.5 % EX CREA: TOPICAL_CREAM | Freq: Two times a day (BID) | CUTANEOUS | 11 refills | Status: AC

## 2021-01-17 MED ORDER — FLUCONAZOLE 150 MG PO TABS
ORAL_TABLET | ORAL | 12 refills | Status: DC
Start: 2021-01-17 — End: 2022-01-24

## 2021-01-17 MED ORDER — TERCONAZOLE 0.4 % VA CREA: TOPICAL_CREAM | VAGINAL | 12 refills | Status: AC

## 2021-02-08 ENCOUNTER — Ambulatory Visit: Payer: Medicaid Other | Admitting: Obstetrics & Gynecology

## 2021-02-12 ENCOUNTER — Ambulatory Visit: Payer: Medicaid Other | Admitting: Obstetrics & Gynecology

## 2021-02-23 ENCOUNTER — Other Ambulatory Visit: Payer: Self-pay

## 2021-02-23 ENCOUNTER — Encounter: Payer: Self-pay | Admitting: Obstetrics & Gynecology

## 2021-02-23 ENCOUNTER — Ambulatory Visit: Payer: Medicaid Other | Admitting: Obstetrics & Gynecology

## 2021-02-23 VITALS — BP 113/78 | HR 88 | Ht 59.0 in | Wt 133.0 lb

## 2021-02-23 DIAGNOSIS — N3010 Interstitial cystitis (chronic) without hematuria: Secondary | ICD-10-CM | POA: Diagnosis not present

## 2021-02-23 MED ORDER — URIBEL 118 MG PO CAPS: 1.0000 | ORAL_CAPSULE | Freq: Two times a day (BID) | ORAL | 11 refills | Status: DC | PRN

## 2021-02-23 NOTE — Progress Notes (Signed)
Diagnosed with IC: >10 years  Current Meds:  Uribel, DMSO Dietary restrictions    Pt states her symptoms have been stable, no exacerbations, although not perfect Wants to continue on this cycle  Blood pressure 113/78, pulse 88, height 4\' 11"  (1.499 m), weight 133 lb (60.3 kg), last menstrual period 02/23/2021.    The external urethra meatus was prepped with betadine DMSO 50 cc was instilled in the usual fashion after the bladder was catheterized and emptied completely 50cc was instilled into the bladder without difficulty and the patient tolerated well She will refrain from voiding as long as possible  Follow up in 8 weeks, or as patient requests based on her symptom complex

## 2021-03-13 ENCOUNTER — Other Ambulatory Visit (HOSPITAL_COMMUNITY): Payer: Self-pay | Admitting: Internal Medicine

## 2021-03-13 DIAGNOSIS — Z1231 Encounter for screening mammogram for malignant neoplasm of breast: Secondary | ICD-10-CM

## 2021-04-05 ENCOUNTER — Other Ambulatory Visit: Payer: Self-pay

## 2021-04-05 ENCOUNTER — Ambulatory Visit (HOSPITAL_COMMUNITY)
Admission: RE | Admit: 2021-04-05 | Discharge: 2021-04-05 | Disposition: A | Discharge status: Home / Self Care | Payer: Medicaid Other | Source: Ambulatory Visit | Attending: Internal Medicine | Admitting: Internal Medicine

## 2021-04-05 DIAGNOSIS — Z1231 Encounter for screening mammogram for malignant neoplasm of breast: Secondary | ICD-10-CM | POA: Diagnosis present

## 2021-04-24 ENCOUNTER — Ambulatory Visit: Payer: Medicaid Other | Admitting: Obstetrics & Gynecology

## 2021-05-01 ENCOUNTER — Ambulatory Visit: Payer: Medicaid Other | Admitting: Obstetrics & Gynecology

## 2021-05-10 ENCOUNTER — Ambulatory Visit: Payer: Medicaid Other | Admitting: Obstetrics & Gynecology

## 2021-05-29 ENCOUNTER — Other Ambulatory Visit: Payer: Self-pay

## 2021-05-29 ENCOUNTER — Encounter: Payer: Self-pay | Admitting: Obstetrics & Gynecology

## 2021-05-29 ENCOUNTER — Ambulatory Visit (INDEPENDENT_AMBULATORY_CARE_PROVIDER_SITE_OTHER): Payer: Medicaid Other | Admitting: Obstetrics & Gynecology

## 2021-05-29 VITALS — BP 127/87 | HR 99 | Ht 59.0 in | Wt 126.0 lb

## 2021-05-29 DIAGNOSIS — N3010 Interstitial cystitis (chronic) without hematuria: Secondary | ICD-10-CM | POA: Diagnosis not present

## 2021-05-29 NOTE — Progress Notes (Signed)
Diagnosed with IC: many years ago  Current Meds:  DMSO Dietary restrictions    Pt states her symptoms have been stable, no exacerbations, although not perfect Wants to continue on this cycle  Blood pressure 127/87, pulse 99, height 4\' 11"  (1.499 m), weight 126 lb (57.2 kg), last menstrual period 05/21/2021.    The external urethra meatus was prepped with betadine DMSO 50 cc was instilled in the usual fashion after the bladder was catheterized and emptied completely 50cc was instilled into the bladder without difficulty and the patient tolerated well She will refrain from voiding as long as possible  Follow up in 8 weeks, or as patient requests based on her symptom complex

## 2021-06-19 ENCOUNTER — Other Ambulatory Visit: Payer: Self-pay

## 2021-06-19 ENCOUNTER — Telehealth: Payer: Self-pay | Admitting: *Deleted

## 2021-06-19 ENCOUNTER — Ambulatory Visit (INDEPENDENT_AMBULATORY_CARE_PROVIDER_SITE_OTHER): Payer: Medicaid Other | Admitting: Dermatology

## 2021-06-19 ENCOUNTER — Encounter: Payer: Self-pay | Admitting: Dermatology

## 2021-06-19 DIAGNOSIS — L700 Acne vulgaris: Secondary | ICD-10-CM | POA: Diagnosis not present

## 2021-06-19 DIAGNOSIS — L719 Rosacea, unspecified: Secondary | ICD-10-CM | POA: Diagnosis not present

## 2021-06-19 MED ORDER — METRONIDAZOLE 0.75 % EX CREA: TOPICAL_CREAM | Freq: Two times a day (BID) | CUTANEOUS | 4 refills | Status: AC

## 2021-06-19 MED ORDER — ARAZLO 0.045 % EX LOTN: TOPICAL_LOTION | CUTANEOUS | 1 refills | Status: DC

## 2021-06-19 MED ORDER — IVERMECTIN 1 % EX CREA: TOPICAL_CREAM | CUTANEOUS | 2 refills | Status: DC

## 2021-06-19 NOTE — Telephone Encounter (Signed)
Sent in prescription to [facility] per dr Jorja Loa.

## 2021-06-19 NOTE — Telephone Encounter (Signed)
[facility] sent over authorization for ivermectin- per dr tafeen ok to give metronidazole- according to chart patient is allergic. Left message to inform patient - left message to call us back.

## 2021-06-21 ENCOUNTER — Telehealth: Payer: Self-pay | Admitting: Dermatology

## 2021-06-21 NOTE — Telephone Encounter (Signed)
Called patient to instruct on how to use the Metronidazole Cream.

## 2021-06-21 NOTE — Telephone Encounter (Signed)
Patient is calling to confirm how to use Metronidazole 0.75% Topical.

## 2021-06-25 ENCOUNTER — Telehealth: Payer: Self-pay | Admitting: Dermatology

## 2021-06-25 NOTE — Telephone Encounter (Signed)
Phone call to patient to let her know that we did not call in prescription to [facility] and that it was for her son's medication.

## 2021-06-25 NOTE — Telephone Encounter (Signed)
She's using the metrogel on face and [facility] called her and told her they had another med for her---the same one he Rx'd for her son. Is she supposed to this as well, or is she just supposed to use the metrogel?

## 2021-06-30 ENCOUNTER — Encounter: Payer: Self-pay | Admitting: Dermatology

## 2021-06-30 NOTE — Progress Notes (Signed)
   New Patient Visit   Subjective  Debra Lewis is a 45 y.o. female who presents for the following: Acne (Face only - clindamycin gel give by dr hall- ? Rosacea per dr hall- son in room with patient).  Breaking out on face, topical clindamycin from primary care physician of some benefit  Location:  Duration:  Quality:  Associated Signs/Symptoms: Modifying Factors:  Severity:  Timing: Context:   Objective  Well appearing patient in no apparent distress; mood and affect are within normal limits. Head - Anterior (Face) Acne Debra Lewis: Because she has almost exclusively inflammatory papules, we will we will initially hold on use of any topical retinoids.  Head I agree with Dr. Catalina Lewis that clinically the patient's facial breakout is an integrade of adult acne and rosacea, with some postinflammatory hyperpigmentation.    A focused examination was performed including head and neck.. Relevant physical exam findings are noted in the Assessment and Plan.   Assessment & Plan    Acne vulgaris Head - Anterior (Face)    Ivermectin (SOOLANTRA) 1 % CREA - Head - Anterior (Face) Apply to face daily for 6 weeks  Rosacea Head  We discussed the path mechanism of these 2, disorders in some detail.  Initially she will try cilantro or generic ivermectin cream nightly for 6 weeks.  Follow-up by MyChart or telephone at that time.      I, Janalyn Harder, MD, have reviewed all documentation for this visit.  The documentation on 06/30/21 for the exam, diagnosis, procedures, and orders are all accurate and complete.

## 2021-07-24 ENCOUNTER — Ambulatory Visit: Payer: Medicaid Other | Admitting: Obstetrics & Gynecology

## 2021-07-24 ENCOUNTER — Other Ambulatory Visit: Payer: Self-pay

## 2021-07-24 ENCOUNTER — Encounter: Payer: Self-pay | Admitting: Obstetrics & Gynecology

## 2021-07-24 VITALS — BP 107/77 | HR 98 | Ht 59.0 in | Wt 122.0 lb

## 2021-07-24 DIAGNOSIS — B373 Candidiasis of vulva and vagina: Secondary | ICD-10-CM | POA: Diagnosis not present

## 2021-07-24 DIAGNOSIS — N3010 Interstitial cystitis (chronic) without hematuria: Secondary | ICD-10-CM

## 2021-07-24 DIAGNOSIS — B3731 Acute candidiasis of vulva and vagina: Secondary | ICD-10-CM

## 2021-07-24 MED ORDER — SILVER SULFADIAZINE 1 % EX CREA: TOPICAL_CREAM | CUTANEOUS | 11 refills | Status: DC

## 2021-07-24 NOTE — Progress Notes (Signed)
Diagnosed with IC: >10 years ago  Current Meds:  Uribel, DMSO Dietary restrictions    Pt states her symptoms have been stable, no exacerbations, although not perfect Wants to continue on this cycle  Blood pressure 107/77, pulse 98, height 4\' 11"  (1.499 m), weight 122 lb (55.3 kg).   Vagina treated with gentian violet paint for vaginal yeast  The external urethra meatus was prepped with betadine DMSO 50 cc was instilled in the usual fashion after the bladder was catheterized and emptied completely 50cc was instilled into the bladder without difficulty and the patient tolerated well She will refrain from voiding as long as possible  Follow up in 8 weeks, or as patient requests based on her symptom complex

## 2021-07-30 ENCOUNTER — Other Ambulatory Visit: Payer: Self-pay | Admitting: Obstetrics & Gynecology

## 2021-07-30 MED ORDER — FLUCONAZOLE 100 MG PO TABS: ORAL_TABLET | ORAL | 0 refills | Status: DC

## 2021-07-31 ENCOUNTER — Ambulatory Visit: Payer: Medicaid Other | Admitting: Dermatology

## 2021-08-02 ENCOUNTER — Other Ambulatory Visit: Payer: Self-pay

## 2021-08-02 ENCOUNTER — Ambulatory Visit (INDEPENDENT_AMBULATORY_CARE_PROVIDER_SITE_OTHER): Payer: Medicaid Other | Admitting: Obstetrics & Gynecology

## 2021-08-02 ENCOUNTER — Encounter: Payer: Self-pay | Admitting: Obstetrics & Gynecology

## 2021-08-02 VITALS — BP 122/79 | HR 103 | Ht 59.0 in | Wt 122.0 lb

## 2021-08-02 DIAGNOSIS — N762 Acute vulvitis: Secondary | ICD-10-CM

## 2021-08-02 MED ORDER — FLUOCINONIDE EMULSIFIED BASE 0.05 % EX CREA: 1.0000 "application " | TOPICAL_CREAM | Freq: Two times a day (BID) | CUTANEOUS | 1 refills | Status: DC

## 2021-08-02 NOTE — Progress Notes (Signed)
Chief Complaint  Patient presents with   vaginal burning and itching      45 y.o. G1P1001 No LMP recorded. (Menstrual status: Oral contraceptives). The current method of family planning is OCP (estrogen/progesterone).  Outpatient Encounter Medications as of 08/02/2021  Medication Sig   albuterol (PROVENTIL HFA;VENTOLIN HFA) 108 (90 BASE) MCG/ACT inhaler Inhale 2 puffs into the lungs every 6 (six) hours as needed for wheezing. Reported on 01/17/2016   ALPRAZolam (XANAX) 1 MG tablet Take 1 tablet (1 mg total) by mouth 3 (three) times daily as needed for sleep or anxiety.   atorvastatin (LIPITOR) 40 MG tablet TAKE 1 TABLET BY MOUTHIONCE A DAY.   cetirizine (ZYRTEC) 10 MG tablet Take 10 mg by mouth daily.   Clobetasol Prop Emollient Base 0.05 % emollient cream APPLY TO AFFECTED AREAS TWICE DAILY.   cyanocobalamin (,VITAMIN B-12,) 1000 MCG/ML injection Inject 1,000 mcg into the muscle every 30 (thirty) days.   diphenhydrAMINE HCl (BENADRYL ALLERGY PO) Take by mouth as needed.   empagliflozin (JARDIANCE) 25 MG TABS tablet Take by mouth.   fluconazole (DIFLUCAN) 100 MG tablet 1 tablet every other day for 1 month   fluconazole (DIFLUCAN) 150 MG tablet 1 tablet today, repeat in 3 days   fluocinonide-emollient (LIDEX-E) 0.05 % cream Apply 1 application topically 2 (two) times daily.   hydrOXYzine (ATARAX/VISTARIL) 25 MG tablet Take 25 mg by mouth daily as needed for itching. Reported on 05/09/2016   ibuprofen (ADVIL,MOTRIN) 800 MG tablet Take 800 mg by mouth daily as needed for pain (Associated with nerve damage).   Ivermectin (SOOLANTRA) 1 % CREA Apply to face daily for 6 weeks   levonorgestrel-ethinyl estradiol (PORTIA-28) 0.15-30 MG-MCG tablet TAKE (1) TABLET BY MOUTH ONCE DAILY AS DIRECTED.   Meth-Hyo-M Bl-Na Phos-Ph Sal (URIBEL) 118 MG CAPS Take 1 capsule (118 mg total) by mouth 2 (two) times daily as needed.   metroNIDAZOLE (METROCREAM) 0.75 % cream Apply topically 2 (two) times daily.    omeprazole (PRILOSEC) 20 MG capsule TAKE (1) CAPSULE BY MOUTH EVERY DAY.   pentosan polysulfate (ELMIRON) 100 MG capsule Take 1 capsule (100 mg total) by mouth 3 (three) times daily. (Patient taking differently: Take 100 mg by mouth.)   RESTASIS 0.05 % ophthalmic emulsion 1 drop 2 (two) times daily.   silver sulfADIAZINE (SILVADENE) 1 % cream Apply to areas 2-3 times daily (Patient taking differently: as needed. Apply to areas 2-3 times daily)   silver sulfADIAZINE (SILVADENE) 1 % cream Apply to area 3 times daily prn   SYMBICORT 80-4.5 MCG/ACT inhaler Inhale 2 puffs into the lungs 2 (two) times daily.   terconazole (TERAZOL 7) 0.4 % vaginal cream INSERT ONE APPLICATORFUL PER VAGINA AT BEDTIME.   tiZANidine (ZANAFLEX) 2 MG tablet Take 4 mg by mouth every 6 (six) hours as needed for muscle spasms.   triamcinolone cream (KENALOG) 0.5 % Apply topically 2 (two) times daily.   TRULICITY 1.5 MG/0.5ML SOPN SMARTSIG:0.5 Milliliter(s) SUB-Q Once a Week   UNABLE TO FIND Depression med-unsure of name-1 daily   Vitamin D, Ergocalciferol, (DRISDOL) 1.25 MG (50000 UT) CAPS capsule Take 50,000 Units by mouth once a week.   Facility-Administered Encounter Medications as of 08/02/2021  Medication   dimethyl sulfoxide 50 % solution 50 mL    Subjective Pt with increased vulvo vaginal tenderness sensitivity started the day after the gentian violet treatment for a persistent yeast infection which she has frequently Began diflucan Past Medical History:  Diagnosis Date  Abnormal pap    ADHD (attention deficit hyperactivity disorder)    Anxiety    Asthma    Bacterial vaginosis    Chronic constipation    Depression    Diabetes mellitus without complication (HCC)    GERD (gastroesophageal reflux disease)    History of frequent urinary tract infections    HSV-2 (herpes simplex virus 2) infection    Interstitial cystitis    Migraines    OCD (obsessive compulsive disorder)    Pityriasis rosea     Past  Surgical History:  Procedure Laterality Date   conization of cervix     ESOPHAGEAL DILATION     ORIF ELBOW FRACTURE Right 10/01/2016   Procedure: RIGHT CAPITELLUM OPEN REDUCTION INTERNAL FIXATION;  Surgeon: Betha Loa, MD;  Location: [facility];  Service: Orthopedics;  Laterality: Right;    OB History     Gravida  1   Para  1   Term  1   Preterm      AB      Living  1      SAB      IAB      Ectopic      Multiple      Live Births  1           Allergies  Allergen Reactions   Latex Anaphylaxis    She is not allergic to latex, but her son has the anaphylaxis reaction   Gentian Violet Dermatitis    Erythema of the perineum and vestibule   Penicillins Nausea Only    Sick on stomach   Codeine Rash    Hives and itching   Flagyl [Metronidazole Hcl] Nausea And Vomiting, Swelling and Rash   Sulfonamide Derivatives Swelling and Rash    Social History   Socioeconomic History   Marital status: Single    Spouse name: Not on file   Number of children: 1   Years of education: Not on file   Highest education level: Not on file  Occupational History   Not on file  Tobacco Use   Smoking status: Never   Smokeless tobacco: Never  Vaping Use   Vaping Use: Never used  Substance and Sexual Activity   Alcohol use: Yes    Comment: socially   Drug use: No   Sexual activity: Yes    Birth control/protection: Pill  Other Topics Concern   Not on file  Social History Narrative   Not on file   Social Determinants of Health   Financial Resource Strain: Low Risk    Difficulty of Paying Living Expenses: Not very hard  Food Insecurity: No Food Insecurity   Worried About Running Out of Food in the Last Year: Never true   Ran Out of Food in the Last Year: Never true  Transportation Needs: No Transportation Needs   Lack of Transportation (Medical): No   Lack of Transportation (Non-Medical): No  Physical Activity: Insufficiently Active   Days of Exercise  per Week: 3 days   Minutes of Exercise per Session: 40 min  Stress: Stress Concern Present   Feeling of Stress : Very much  Social Connections: Socially Isolated   Frequency of Communication with Friends and Family: Once a week   Frequency of Social Gatherings with Friends and Family: Once a week   Attends Religious Services: More than 4 times per year   Active Member of [employer] or Organizations: No   Attends Banker Meetings: Never   Marital  Status: Never married    Family History  Problem Relation Age of Onset   Breast cancer Maternal Aunt    Cancer Other    Heart disease Other    Diabetes Other    Lung disease Other    Lung cancer Sister    Diabetes Sister    Depression Mother    Arthritis Mother    Heart disease Mother    Diabetes Mother    Drug abuse Father    Stroke Father    Dementia Father    Asthma Son    Breast cancer Paternal Aunt     Medications:       Current Outpatient Medications:    albuterol (PROVENTIL HFA;VENTOLIN HFA) 108 (90 BASE) MCG/ACT inhaler, Inhale 2 puffs into the lungs every 6 (six) hours as needed for wheezing. Reported on 01/17/2016, Disp: , Rfl:    ALPRAZolam (XANAX) 1 MG tablet, Take 1 tablet (1 mg total) by mouth 3 (three) times daily as needed for sleep or anxiety., Disp: 90 tablet, Rfl: 2   atorvastatin (LIPITOR) 40 MG tablet, TAKE 1 TABLET BY MOUTHIONCE A DAY., Disp: , Rfl:    cetirizine (ZYRTEC) 10 MG tablet, Take 10 mg by mouth daily., Disp: , Rfl:    Clobetasol Prop Emollient Base 0.05 % emollient cream, APPLY TO AFFECTED AREAS TWICE DAILY., Disp: 30 g, Rfl: 12   cyanocobalamin (,VITAMIN B-12,) 1000 MCG/ML injection, Inject 1,000 mcg into the muscle every 30 (thirty) days., Disp: , Rfl:    diphenhydrAMINE HCl (BENADRYL ALLERGY PO), Take by mouth as needed., Disp: , Rfl:    empagliflozin (JARDIANCE) 25 MG TABS tablet, Take by mouth., Disp: , Rfl:    fluconazole (DIFLUCAN) 100 MG tablet, 1 tablet every other day for 1 month,  Disp: 14 tablet, Rfl: 0   fluconazole (DIFLUCAN) 150 MG tablet, 1 tablet today, repeat in 3 days, Disp: 2 tablet, Rfl: 12   fluocinonide-emollient (LIDEX-E) 0.05 % cream, Apply 1 application topically 2 (two) times daily., Disp: 15 g, Rfl: 1   hydrOXYzine (ATARAX/VISTARIL) 25 MG tablet, Take 25 mg by mouth daily as needed for itching. Reported on 05/09/2016, Disp: , Rfl:    ibuprofen (ADVIL,MOTRIN) 800 MG tablet, Take 800 mg by mouth daily as needed for pain (Associated with nerve damage)., Disp: , Rfl:    Ivermectin (SOOLANTRA) 1 % CREA, Apply to face daily for 6 weeks, Disp: 45 g, Rfl: 2   levonorgestrel-ethinyl estradiol (PORTIA-28) 0.15-30 MG-MCG tablet, TAKE (1) TABLET BY MOUTH ONCE DAILY AS DIRECTED., Disp: 28 tablet, Rfl: 12   Meth-Hyo-M Bl-Na Phos-Ph Sal (URIBEL) 118 MG CAPS, Take 1 capsule (118 mg total) by mouth 2 (two) times daily as needed., Disp: 120 capsule, Rfl: 11   metroNIDAZOLE (METROCREAM) 0.75 % cream, Apply topically 2 (two) times daily., Disp: 45 g, Rfl: 4   omeprazole (PRILOSEC) 20 MG capsule, TAKE (1) CAPSULE BY MOUTH EVERY DAY., Disp: 30 capsule, Rfl: 11   pentosan polysulfate (ELMIRON) 100 MG capsule, Take 1 capsule (100 mg total) by mouth 3 (three) times daily. (Patient taking differently: Take 100 mg by mouth.), Disp: 90 capsule, Rfl: 11   RESTASIS 0.05 % ophthalmic emulsion, 1 drop 2 (two) times daily., Disp: , Rfl:    silver sulfADIAZINE (SILVADENE) 1 % cream, Apply to areas 2-3 times daily (Patient taking differently: as needed. Apply to areas 2-3 times daily), Disp: 50 g, Rfl: 11   silver sulfADIAZINE (SILVADENE) 1 % cream, Apply to area 3 times daily prn, Disp:  50 g, Rfl: 11   SYMBICORT 80-4.5 MCG/ACT inhaler, Inhale 2 puffs into the lungs 2 (two) times daily., Disp: , Rfl:    terconazole (TERAZOL 7) 0.4 % vaginal cream, INSERT ONE APPLICATORFUL PER VAGINA AT BEDTIME., Disp: 45 g, Rfl: 12   tiZANidine (ZANAFLEX) 2 MG tablet, Take 4 mg by mouth every 6 (six) hours as  needed for muscle spasms., Disp: , Rfl:    triamcinolone cream (KENALOG) 0.5 %, Apply topically 2 (two) times daily., Disp: 30 g, Rfl: 11   TRULICITY 1.5 MG/0.5ML SOPN, SMARTSIG:0.5 Milliliter(s) SUB-Q Once a Week, Disp: , Rfl:    UNABLE TO FIND, Depression med-unsure of name-1 daily, Disp: , Rfl:    Vitamin D, Ergocalciferol, (DRISDOL) 1.25 MG (50000 UT) CAPS capsule, Take 50,000 Units by mouth once a week., Disp: , Rfl:   Current Facility-Administered Medications:    dimethyl sulfoxide 50 % solution 50 mL, 50 mL, Urethral, Once, Therese Rocco, Amaryllis Dyke, MD  Objective Blood pressure 122/79, pulse (!) 103, height 4\' 11"  (1.499 m), weight 122 lb (55.3 kg).  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    Dispense:  15 g    Refill:  1    Labs or Scans Ordered: No orders of the defined types were placed in this encounter.   Management:: Topical lidex for hypersensitivity reaction to the gentian violet  Follow up Return for keep scheduled.      All questions were answered.

## 2021-08-08 ENCOUNTER — Telehealth: Payer: Self-pay | Admitting: Adult Health

## 2021-08-08 NOTE — Telephone Encounter (Signed)
Returned pt's call. Two identifiers used.  Pt stated that she's been using the cream that was prescribed with only a slight improvement, almost unnoticeable. She has tried Benadryl, cold compresses, and taking her meds as prescribed, but she is still very irritated, painful and there is itching and burning. She is unsure if she needs another bladder irrigation sooner than when scheduled in November. Told pt her concerns would be sent to Dr Despina Hidden to address. Pt stated she can be reached via Mychart also.

## 2021-08-08 NOTE — Telephone Encounter (Signed)
Pt seen recently, had allergic reaction to "purple paint" & is still very irritated, burns w/urination Pt not sure if this is related to the allergic reaction, related to her Interstitial cystitis, or a UTI  Please advise & notify pt  [place]

## 2021-08-09 ENCOUNTER — Other Ambulatory Visit: Payer: Self-pay | Admitting: Obstetrics & Gynecology

## 2021-08-09 MED ORDER — PREDNISONE 10 MG PO TABS: ORAL_TABLET | ORAL | 0 refills | Status: DC

## 2021-08-09 NOTE — Telephone Encounter (Signed)
Called pt to inform her of new prescription and how to take it. Two identifiers used. Pt confirmed understanding. Will call if she is still having issues after completing the medication.

## 2021-08-09 NOTE — Telephone Encounter (Signed)
Let's try a 10 day course of prednisone and see if that will improve it, it will make her sugars go up a bit

## 2021-08-15 ENCOUNTER — Other Ambulatory Visit (INDEPENDENT_AMBULATORY_CARE_PROVIDER_SITE_OTHER): Payer: Medicaid Other | Admitting: *Deleted

## 2021-08-15 ENCOUNTER — Other Ambulatory Visit: Payer: Self-pay

## 2021-08-15 DIAGNOSIS — R350 Frequency of micturition: Secondary | ICD-10-CM

## 2021-08-15 DIAGNOSIS — R300 Dysuria: Secondary | ICD-10-CM

## 2021-08-15 LAB — POCT URINALYSIS DIPSTICK OB
Blood, UA: NEGATIVE
Leukocytes, UA: NEGATIVE
Nitrite, UA: NEGATIVE
POC,PROTEIN,UA: NEGATIVE

## 2021-08-15 NOTE — Progress Notes (Addendum)
   NURSE VISIT- UTI SYMPTOMS   SUBJECTIVE:  Debra Lewis is a 45 y.o. G39P1001 female here for UTI symptoms. She is a GYN patient. She reports dysuria and urinary frequency.  OBJECTIVE:  There were no vitals taken for this visit.  Appears well, in no apparent distress  Results for orders placed or performed in visit on 08/15/21 (from the past 24 hour(s))  POC Urinalysis Dipstick OB   Collection Time: 08/15/21 11:31 AM  Result Value Ref Range   Color, UA     Clarity, UA     Glucose, UA Moderate (2+) (A) Negative   Bilirubin, UA     Ketones, UA trace    Spec Grav, UA     Blood, UA neg    pH, UA     POC,PROTEIN,UA Negative Negative, Trace, Small (1+), Moderate (2+), Large (3+), 4+   Urobilinogen, UA     Nitrite, UA neg    Leukocytes, UA Negative Negative   Appearance     Odor      ASSESSMENT: GYN patient with UTI symptoms and negative nitrites  PLAN: Note routed to Dr. Despina Hidden   Rx sent by provider today: No Urine culture sent Call or return to clinic prn if these symptoms worsen or fail to improve as anticipated. Follow-up: as scheduled   Annamarie Dawley  08/15/2021 11:33 AM   Attestation of Attending Supervision of Nursing Visit Encounter: Evaluation and management procedures were performed by the nursing staff under my supervision and collaboration.  I have reviewed the nurse's note and chart, and I agree with the management and plan.  Rockne Coons MD Attending Physician for the Center for [facility] 08/20/2021 8:52 AM

## 2021-08-17 ENCOUNTER — Encounter: Payer: Self-pay | Admitting: Obstetrics & Gynecology

## 2021-08-17 ENCOUNTER — Other Ambulatory Visit: Payer: Self-pay

## 2021-08-17 ENCOUNTER — Ambulatory Visit: Payer: Medicaid Other | Admitting: Obstetrics & Gynecology

## 2021-08-17 VITALS — BP 116/77 | HR 82 | Ht 59.0 in | Wt 120.5 lb

## 2021-08-17 DIAGNOSIS — N3010 Interstitial cystitis (chronic) without hematuria: Secondary | ICD-10-CM | POA: Diagnosis not present

## 2021-08-17 NOTE — Progress Notes (Signed)
Diagnosed with IC: decade or more ago  Current Meds:  DMSO + elmiron Dietary restrictions    Pt states her symptoms have been stable, no exacerbations, although not perfect Wants to continue on this cycle  Blood pressure 116/77, pulse 82, height 4\' 11"  (1.499 m), weight 120 lb 8 oz (54.7 kg), last menstrual period 08/10/2021.    The external urethra meatus was prepped with betadine DMSO 50 cc was instilled in the usual fashion after the bladder was catheterized and emptied completely 50cc was instilled into the bladder without difficulty and the patient tolerated well She will refrain from voiding as long as possible  Follow up in 8 weeks, or as patient requests based on her symptom complex

## 2021-08-21 ENCOUNTER — Other Ambulatory Visit: Payer: Self-pay | Admitting: Obstetrics & Gynecology

## 2021-08-21 LAB — URINE CULTURE

## 2021-08-21 MED ORDER — CIPROFLOXACIN HCL 500 MG PO TABS: 500.0000 mg | ORAL_TABLET | Freq: Two times a day (BID) | ORAL | 0 refills | Status: DC

## 2021-08-27 ENCOUNTER — Other Ambulatory Visit: Payer: Medicaid Other | Admitting: *Deleted

## 2021-08-27 ENCOUNTER — Other Ambulatory Visit: Payer: Self-pay

## 2021-08-27 DIAGNOSIS — R8290 Unspecified abnormal findings in urine: Secondary | ICD-10-CM

## 2021-08-27 NOTE — Progress Notes (Signed)
   NURSE VISIT- UTI SYMPTOMS   SUBJECTIVE:  Debra Lewis is a 45 y.o. G35P1001 female here for UTI symptoms. She is a GYN patient. She reports cloudy urine.  OBJECTIVE:  LMP 08/10/2021 (Approximate)   Appears well, in no apparent distress  No results found for this or any previous visit (from the past 24 hour(s)).  ASSESSMENT: GYN patient with UTI symptoms and negative nitrites  PLAN: Note routed to Dr. Despina Hidden   Rx sent by provider today: No sentUrine culture  Call or return to clinic prn if these symptoms worsen or fail to improve as anticipated. Follow-up: as needed   Annamarie Dawley  08/27/2021 10:17 AM

## 2021-08-31 LAB — URINE CULTURE: Organism ID, Bacteria: NO GROWTH

## 2021-09-12 ENCOUNTER — Other Ambulatory Visit: Payer: Self-pay | Admitting: Obstetrics & Gynecology

## 2021-09-18 ENCOUNTER — Other Ambulatory Visit: Payer: Self-pay | Admitting: Obstetrics & Gynecology

## 2021-09-18 ENCOUNTER — Encounter: Payer: Self-pay | Admitting: Obstetrics & Gynecology

## 2021-09-18 ENCOUNTER — Ambulatory Visit (INDEPENDENT_AMBULATORY_CARE_PROVIDER_SITE_OTHER): Payer: Medicaid Other | Admitting: Obstetrics & Gynecology

## 2021-09-18 ENCOUNTER — Other Ambulatory Visit: Payer: Self-pay

## 2021-09-18 ENCOUNTER — Ambulatory Visit: Payer: Medicaid Other | Admitting: Obstetrics & Gynecology

## 2021-09-18 DIAGNOSIS — N3010 Interstitial cystitis (chronic) without hematuria: Secondary | ICD-10-CM

## 2021-09-18 NOTE — Progress Notes (Signed)
Diagnosed with IC: many   Current Meds:  Elmiron DMSO Dietary restrictions    Pt states her symptoms have been stable, no exacerbations, although not perfect Wants to continue on this cycle  Last menstrual period 08/20/2021.    The external urethra meatus was prepped with betadine DMSO 50 cc was instilled in the usual fashion after the bladder was catheterized and emptied completely 50cc was instilled into the bladder without difficulty and the patient tolerated well She will refrain from voiding as long as possible  Follow up in 8 weeks, or as patient requests based on her symptom complex

## 2021-10-01 ENCOUNTER — Ambulatory Visit: Payer: Medicaid Other | Admitting: Obstetrics & Gynecology

## 2021-11-12 ENCOUNTER — Ambulatory Visit: Payer: Medicaid Other | Admitting: Dermatology

## 2021-11-13 ENCOUNTER — Ambulatory Visit: Payer: Medicaid Other | Admitting: Obstetrics & Gynecology

## 2021-11-13 ENCOUNTER — Encounter: Payer: Self-pay | Admitting: Obstetrics & Gynecology

## 2021-11-13 ENCOUNTER — Other Ambulatory Visit: Payer: Self-pay

## 2021-11-13 ENCOUNTER — Ambulatory Visit (INDEPENDENT_AMBULATORY_CARE_PROVIDER_SITE_OTHER): Payer: Medicaid Other | Admitting: Obstetrics & Gynecology

## 2021-11-13 VITALS — BP 107/75 | HR 81 | Ht 59.0 in | Wt 119.5 lb

## 2021-11-13 DIAGNOSIS — N3010 Interstitial cystitis (chronic) without hematuria: Secondary | ICD-10-CM | POA: Diagnosis not present

## 2021-11-13 NOTE — Progress Notes (Signed)
Diagnosed with IC: years ago  Current Meds:  Elmiron, DMSO Dietary restrictions    Pt states her symptoms have been stable, no exacerbations, although not perfect Wants to continue on this cycle  Blood pressure 107/75, pulse 81, height 4\' 11"  (1.499 m), weight 119 lb 8 oz (54.2 kg), last menstrual period 10/19/2021.    The external urethra meatus was prepped with betadine DMSO 50 cc was instilled in the usual fashion after the bladder was catheterized and emptied completely 50cc was instilled into the bladder without difficulty and the patient tolerated well She will refrain from voiding as long as possible  Follow up in 8 weeks, or as patient requests based on her symptom complex

## 2021-11-29 ENCOUNTER — Ambulatory Visit: Payer: Medicaid Other | Admitting: Obstetrics & Gynecology

## 2021-12-24 ENCOUNTER — Other Ambulatory Visit: Payer: Self-pay | Admitting: Obstetrics & Gynecology

## 2022-01-03 ENCOUNTER — Encounter: Payer: Self-pay | Admitting: Obstetrics & Gynecology

## 2022-01-03 ENCOUNTER — Ambulatory Visit (INDEPENDENT_AMBULATORY_CARE_PROVIDER_SITE_OTHER): Payer: Medicaid Other | Admitting: Obstetrics & Gynecology

## 2022-01-03 ENCOUNTER — Other Ambulatory Visit: Payer: Self-pay

## 2022-01-03 VITALS — BP 108/76 | HR 94 | Ht 59.0 in | Wt 118.0 lb

## 2022-01-03 DIAGNOSIS — Z01419 Encounter for gynecological examination (general) (routine) without abnormal findings: Secondary | ICD-10-CM | POA: Diagnosis not present

## 2022-01-03 NOTE — Progress Notes (Signed)
Subjective:     Debra Lewis is a 46 y.o. female here for a routine exam.  Patient's last menstrual period was 12/28/2021. G1P1001 Birth Control Method:  COC Menstrual Calendar(currently): regular  Current complaints: none.   Current acute medical issues:  none   Recent Gynecologic History Patient's last menstrual period was 12/28/2021. Last Pap: 3/22,  normal Last mammogram: 03/2021  normal  Past Medical History:  Diagnosis Date   Abnormal pap    ADHD (attention deficit hyperactivity disorder)    Anxiety    Asthma    Bacterial vaginosis    Chronic constipation    Depression    Diabetes mellitus without complication (HCC)    GERD (gastroesophageal reflux disease)    High cholesterol    History of frequent urinary tract infections    HSV-2 (herpes simplex virus 2) infection    Interstitial cystitis    Migraines    OCD (obsessive compulsive disorder)    Pityriasis rosea     Past Surgical History:  Procedure Laterality Date   conization of cervix     ESOPHAGEAL DILATION     ORIF ELBOW FRACTURE Right 10/01/2016   Procedure: RIGHT CAPITELLUM OPEN REDUCTION INTERNAL FIXATION;  Surgeon: Betha Loa, MD;  Location: [facility];  Service: Orthopedics;  Laterality: Right;    OB History     Gravida  1   Para  1   Term  1   Preterm      AB      Living  1      SAB      IAB      Ectopic      Multiple      Live Births  1           Social History   Socioeconomic History   Marital status: Single    Spouse name: Not on file   Number of children: 1   Years of education: Not on file   Highest education level: Not on file  Occupational History   Not on file  Tobacco Use   Smoking status: Never   Smokeless tobacco: Never  Vaping Use   Vaping Use: Never used  Substance and Sexual Activity   Alcohol use: Yes    Comment: socially   Drug use: No   Sexual activity: Not Currently    Birth control/protection: Pill  Other Topics Concern    Not on file  Social History Narrative   Not on file   Social Determinants of Health   Financial Resource Strain: Medium Risk   Difficulty of Paying Living Expenses: Somewhat hard  Food Insecurity: No Food Insecurity   Worried About Running Out of Food in the Last Year: Never true   Ran Out of Food in the Last Year: Never true  Transportation Needs: No Transportation Needs   Lack of Transportation (Medical): No   Lack of Transportation (Non-Medical): No  Physical Activity: Sufficiently Active   Days of Exercise per Week: 5 days   Minutes of Exercise per Session: 30 min  Stress: Stress Concern Present   Feeling of Stress : Very much  Social Connections: Moderately Integrated   Frequency of Communication with Friends and Family: Once a week   Frequency of Social Gatherings with Friends and Family: Twice a week   Attends Religious Services: 1 to 4 times per year   Active Member of [employer] or Organizations: Yes   Attends Banker Meetings: 1 to 4 times per  year   Marital Status: Never married    Family History  Problem Relation Age of Onset   Breast cancer Maternal Aunt    Cancer Other    Heart disease Other    Diabetes Other    Lung disease Other    Lung cancer Sister    Diabetes Sister    Depression Mother    Arthritis Mother    Heart disease Mother    Diabetes Mother    Drug abuse Father    Stroke Father    Dementia Father    Asthma Son    Breast cancer Paternal Aunt      Current Outpatient Medications:    albuterol (PROVENTIL HFA;VENTOLIN HFA) 108 (90 BASE) MCG/ACT inhaler, Inhale 2 puffs into the lungs every 6 (six) hours as needed for wheezing. Reported on 01/17/2016, Disp: , Rfl:    ALPRAZolam (XANAX) 1 MG tablet, Take 1 tablet (1 mg total) by mouth 3 (three) times daily as needed for sleep or anxiety., Disp: 90 tablet, Rfl: 2   atorvastatin (LIPITOR) 40 MG tablet, TAKE 1 TABLET BY MOUTHIONCE A DAY., Disp: , Rfl:    cetirizine (ZYRTEC) 10 MG tablet,  Take 10 mg by mouth daily., Disp: , Rfl:    Clobetasol Prop Emollient Base 0.05 % emollient cream, APPLY TO AFFECTED AREAS TWICE DAILY., Disp: 30 g, Rfl: 12   cyanocobalamin (,VITAMIN B-12,) 1000 MCG/ML injection, Inject 1,000 mcg into the muscle every 30 (thirty) days., Disp: , Rfl:    diphenhydrAMINE HCl (BENADRYL ALLERGY PO), Take by mouth as needed., Disp: , Rfl:    ELMIRON 100 MG capsule, TAKE 1 CAPSULE BY MOUTH THREE TIMES A DAY., Disp: 90 capsule, Rfl: 11   empagliflozin (JARDIANCE) 25 MG TABS tablet, Take by mouth., Disp: , Rfl:    fluconazole (DIFLUCAN) 150 MG tablet, 1 tablet today, repeat in 3 days, Disp: 2 tablet, Rfl: 12   hydrOXYzine (ATARAX/VISTARIL) 25 MG tablet, Take 25 mg by mouth daily as needed for itching. Reported on 05/09/2016, Disp: , Rfl:    ibuprofen (ADVIL,MOTRIN) 800 MG tablet, Take 800 mg by mouth daily as needed for pain (Associated with nerve damage)., Disp: , Rfl:    Ivermectin (SOOLANTRA) 1 % CREA, Apply to face daily for 6 weeks, Disp: 45 g, Rfl: 2   levonorgestrel-ethinyl estradiol (PORTIA-28) 0.15-30 MG-MCG tablet, TAKE (1) TABLET BY MOUTH ONCE DAILY AS DIRECTED., Disp: 28 tablet, Rfl: 12   Meth-Hyo-M Bl-Na Phos-Ph Sal (URIBEL) 118 MG CAPS, Take 1 capsule (118 mg total) by mouth 2 (two) times daily as needed., Disp: 120 capsule, Rfl: 11   metroNIDAZOLE (METROCREAM) 0.75 % cream, Apply topically 2 (two) times daily., Disp: 45 g, Rfl: 4   omeprazole (PRILOSEC) 20 MG capsule, TAKE (1) CAPSULE BY MOUTH EVERY DAY., Disp: 30 capsule, Rfl: 11   RESTASIS 0.05 % ophthalmic emulsion, 1 drop 2 (two) times daily., Disp: , Rfl:    silver sulfADIAZINE (SILVADENE) 1 % cream, Apply to areas 2-3 times daily (Patient taking differently: as needed. Apply to areas 2-3 times daily), Disp: 50 g, Rfl: 11   SYMBICORT 80-4.5 MCG/ACT inhaler, Inhale 2 puffs into the lungs 2 (two) times daily., Disp: , Rfl:    terconazole (TERAZOL 7) 0.4 % vaginal cream, INSERT ONE APPLICATORFUL PER VAGINA  AT BEDTIME., Disp: 45 g, Rfl: 12   tiZANidine (ZANAFLEX) 2 MG tablet, Take 4 mg by mouth every 6 (six) hours as needed for muscle spasms., Disp: , Rfl:    triamcinolone cream (  KENALOG) 0.5 %, Apply topically 2 (two) times daily., Disp: 30 g, Rfl: 11   TRINTELLIX 5 MG TABS tablet, Take 5 mg by mouth daily., Disp: , Rfl:    TRULICITY 1.5 MG/0.5ML SOPN, SMARTSIG:0.5 Milliliter(s) SUB-Q Once a Week, Disp: , Rfl:    Vitamin D, Ergocalciferol, (DRISDOL) 1.25 MG (50000 UT) CAPS capsule, Take 50,000 Units by mouth once a week., Disp: , Rfl:   Current Facility-Administered Medications:    dimethyl sulfoxide 50 % solution 50 mL, 50 mL, Urethral, Once, Lazaro Arms, MD  Review of Systems  Review of Systems  Constitutional: Negative for fever, chills, weight loss, malaise/fatigue and diaphoresis.  HENT: Negative for hearing loss, ear pain, nosebleeds, congestion, sore throat, neck pain, tinnitus and ear discharge.   Eyes: Negative for blurred vision, double vision, photophobia, pain, discharge and redness.  Respiratory: Negative for cough, hemoptysis, sputum production, shortness of breath, wheezing and stridor.   Cardiovascular: Negative for chest pain, palpitations, orthopnea, claudication, leg swelling and PND.  Gastrointestinal: negative for abdominal pain. Negative for heartburn, nausea, vomiting, diarrhea, constipation, blood in stool and melena.  Genitourinary: Negative for dysuria, urgency, frequency, hematuria and flank pain.  Musculoskeletal: Negative for myalgias, back pain, joint pain and falls.  Skin: Negative for itching and rash.  Neurological: Negative for dizziness, tingling, tremors, sensory change, speech change, focal weakness, seizures, loss of consciousness, weakness and headaches.  Endo/Heme/Allergies: Negative for environmental allergies and polydipsia. Does not bruise/bleed easily.  Psychiatric/Behavioral: Negative for depression, suicidal ideas, hallucinations, memory loss and  substance abuse. The patient is not nervous/anxious and does not have insomnia.        Objective:  Blood pressure 108/76, pulse 94, height 4\' 11"  (1.499 m), weight 118 lb (53.5 kg), last menstrual period 12/28/2021.   Physical Exam  Vitals reviewed. Constitutional: She is oriented to person, place, and time. She appears well-developed and well-nourished.  HENT:  Head: Normocephalic and atraumatic.        Right Ear: External ear normal.  Left Ear: External ear normal.  Nose: Nose normal.  Mouth/Throat: Oropharynx is clear and moist.  Eyes: Conjunctivae and EOM are normal. Pupils are equal, round, and reactive to light. Right eye exhibits no discharge. Left eye exhibits no discharge. No scleral icterus.  Neck: Normal range of motion. Neck supple. No tracheal deviation present. No thyromegaly present.  Cardiovascular: Normal rate, regular rhythm, normal heart sounds and intact distal pulses.  Exam reveals no gallop and no friction rub.   No murmur heard. Respiratory: Effort normal and breath sounds normal. No respiratory distress. She has no wheezes. She has no rales. She exhibits no tenderness.  GI: Soft. Bowel sounds are normal. She exhibits no distension and no mass. There is no tenderness. There is no rebound and no guarding.  Genitourinary:  Breasts no masses skin changes or nipple changes bilaterally      Vulva is normal without lesions Vagina is pink moist without discharge Cervix normal in appearance and pap is done Uterus is normal size shape and contour Adnexa is negative with normal sized ovaries   Musculoskeletal: Normal range of motion. She exhibits no edema and no tenderness.  Neurological: She is alert and oriented to person, place, and time. She has normal reflexes. She displays normal reflexes. No cranial nerve deficit. She exhibits normal muscle tone. Coordination normal.  Skin: Skin is warm and dry. No rash noted. No erythema. No pallor.  Psychiatric: She has a normal  mood and affect. Her behavior is normal.  Judgment and thought content normal.       Medications Ordered at today's visit: No orders of the defined types were placed in this encounter.   Other orders placed at today's visit: No orders of the defined types were placed in this encounter.     Assessment:    Normal Gyn exam.    Plan:    Continue COC Pap 2 years  Keep other appt   No follow-ups on file.

## 2022-01-04 ENCOUNTER — Encounter: Payer: Self-pay | Admitting: Obstetrics & Gynecology

## 2022-01-04 ENCOUNTER — Ambulatory Visit (INDEPENDENT_AMBULATORY_CARE_PROVIDER_SITE_OTHER): Payer: Medicaid Other | Admitting: Obstetrics & Gynecology

## 2022-01-04 VITALS — BP 102/70 | HR 87 | Ht 59.0 in | Wt 118.0 lb

## 2022-01-04 DIAGNOSIS — N3010 Interstitial cystitis (chronic) without hematuria: Secondary | ICD-10-CM | POA: Diagnosis not present

## 2022-01-08 ENCOUNTER — Ambulatory Visit: Payer: Medicaid Other | Admitting: Obstetrics & Gynecology

## 2022-01-18 ENCOUNTER — Other Ambulatory Visit: Payer: Self-pay | Admitting: Obstetrics & Gynecology

## 2022-01-23 ENCOUNTER — Encounter: Payer: Self-pay | Admitting: Obstetrics & Gynecology

## 2022-01-23 NOTE — Progress Notes (Signed)
Diagnosed with IC: MANY YEARS ? ?Current Meds: ? ?DMSO ?Dietary restrictions ? ? ? ?Pt states her symptoms have been stable, no exacerbations, although not perfect ?Wants to continue on this cycle ? ?Blood pressure 102/70, pulse 87, height 4\' 11"  (1.499 m), weight 118 lb (53.5 kg), last menstrual period 12/28/2021.  ? ? ?The external urethra meatus was prepped with betadine ?DMSO 50 cc was instilled in the usual fashion after the bladder was catheterized and emptied completely ?50cc was instilled into the bladder without difficulty and the patient tolerated well ?She will refrain from voiding as long as possible ? ?Follow up in 8 weeks, or as patient requests based on her symptom complex ? ?

## 2022-01-24 ENCOUNTER — Telehealth: Payer: Self-pay | Admitting: *Deleted

## 2022-01-24 NOTE — Telephone Encounter (Signed)
Pt is requesting a refill on birth control and Diflucan. She starts a new pack tomorrow. She takes the Diflucan 2 days before period starts and then again when period stops. Dr. Despina Hidden usually gives Diflucan for a year. Can you refill both? Thanks!! JSY ?

## 2022-01-30 ENCOUNTER — Ambulatory Visit: Payer: Medicaid Other | Admitting: Dermatology

## 2022-02-20 ENCOUNTER — Other Ambulatory Visit (HOSPITAL_COMMUNITY): Payer: Self-pay | Admitting: Internal Medicine

## 2022-02-20 DIAGNOSIS — Z1231 Encounter for screening mammogram for malignant neoplasm of breast: Secondary | ICD-10-CM

## 2022-03-01 ENCOUNTER — Ambulatory Visit (INDEPENDENT_AMBULATORY_CARE_PROVIDER_SITE_OTHER): Payer: Medicaid Other | Admitting: Obstetrics & Gynecology

## 2022-03-01 ENCOUNTER — Encounter: Payer: Self-pay | Admitting: Obstetrics & Gynecology

## 2022-03-01 VITALS — BP 107/77 | HR 88 | Ht 59.0 in | Wt 116.0 lb

## 2022-03-01 DIAGNOSIS — N3010 Interstitial cystitis (chronic) without hematuria: Secondary | ICD-10-CM

## 2022-03-01 MED ORDER — URIBEL 118 MG PO CAPS: 1.0000 | ORAL_CAPSULE | Freq: Two times a day (BID) | ORAL | 11 refills | Status: DC | PRN

## 2022-03-01 NOTE — Progress Notes (Signed)
Diagnosed with IC: years ago ? ?Current Meds: ?Uribel  ?DMSO ? ?Dietary restrictions ? ? ? ?Pt states her symptoms have been stable, no exacerbations, although not perfect ?Wants to continue on this cycle ? ?There were no vitals taken for this visit.  ? ? ?The external urethra meatus was prepped with betadine ?DMSO 50 cc was instilled in the usual fashion after the bladder was catheterized and emptied completely ?50cc was instilled into the bladder without difficulty and the patient tolerated well ?She will refrain from voiding as long as possible ? ?Follow up in 8 weeks, or as patient requests based on her symptom complex ? ?

## 2022-03-27 ENCOUNTER — Encounter: Payer: Self-pay | Admitting: *Deleted

## 2022-03-28 ENCOUNTER — Other Ambulatory Visit (HOSPITAL_COMMUNITY): Payer: Self-pay | Admitting: Family Medicine

## 2022-03-28 DIAGNOSIS — N6310 Unspecified lump in the right breast, unspecified quadrant: Secondary | ICD-10-CM

## 2022-04-09 ENCOUNTER — Ambulatory Visit (HOSPITAL_COMMUNITY)
Admission: RE | Admit: 2022-04-09 | Discharge: 2022-04-09 | Disposition: A | Discharge status: Home / Self Care | Payer: Medicaid Other | Source: Ambulatory Visit | Attending: Family Medicine | Admitting: Family Medicine

## 2022-04-09 DIAGNOSIS — N6310 Unspecified lump in the right breast, unspecified quadrant: Secondary | ICD-10-CM

## 2022-04-09 DIAGNOSIS — L720 Epidermal cyst: Secondary | ICD-10-CM | POA: Diagnosis not present

## 2022-04-09 DIAGNOSIS — N6311 Unspecified lump in the right breast, upper outer quadrant: Secondary | ICD-10-CM | POA: Diagnosis not present

## 2022-04-12 ENCOUNTER — Ambulatory Visit (HOSPITAL_COMMUNITY): Payer: Medicaid Other

## 2022-04-17 ENCOUNTER — Ambulatory Visit: Payer: Medicaid Other | Admitting: Dermatology

## 2022-04-23 ENCOUNTER — Telehealth: Payer: Self-pay | Admitting: *Deleted

## 2022-04-23 NOTE — Telephone Encounter (Signed)
I called [facility], the pharmacist said that it is on backorder and not available at this time.

## 2022-04-23 NOTE — Telephone Encounter (Signed)
Pt was called to cancel her visit for a bladder irrigation on Friday due to DMSO being on backorder. We don't know when it may be back in stock. Pt states that she is uncomfortable and wants to know if there is anything else that can be done or a different medicine for the irrigation?

## 2022-04-26 ENCOUNTER — Ambulatory Visit: Payer: Medicaid Other | Admitting: Obstetrics & Gynecology

## 2022-05-31 ENCOUNTER — Ambulatory Visit (INDEPENDENT_AMBULATORY_CARE_PROVIDER_SITE_OTHER): Payer: Medicaid Other | Admitting: Obstetrics & Gynecology

## 2022-05-31 ENCOUNTER — Encounter: Payer: Self-pay | Admitting: Obstetrics & Gynecology

## 2022-05-31 VITALS — BP 111/76 | HR 90 | Ht 59.0 in | Wt 117.5 lb

## 2022-05-31 DIAGNOSIS — N3010 Interstitial cystitis (chronic) without hematuria: Secondary | ICD-10-CM | POA: Diagnosis not present

## 2022-05-31 NOTE — Progress Notes (Signed)
iagnosed with IC: many years ago  Current Meds:  DMSO Dietary restrictions    Pt states her symptoms have been stable, no exacerbations, although not perfect Wants to continue on this cycle  Blood pressure 111/76, pulse 90, height 4\' 11"  (1.499 m), weight 117 lb 8 oz (53.3 kg), last menstrual period 05/13/2022.    The external urethra meatus was prepped with betadine DMSO 50 cc was instilled in the usual fashion after the bladder was catheterized and emptied completely 50cc was instilled into the bladder without difficulty and the patient tolerated well She will refrain from voiding as long as possible  Follow up in 7 weeks, or as patient requests based on her symptom complex

## 2022-07-22 ENCOUNTER — Ambulatory Visit (INDEPENDENT_AMBULATORY_CARE_PROVIDER_SITE_OTHER): Payer: Medicaid Other | Admitting: Obstetrics & Gynecology

## 2022-07-22 ENCOUNTER — Encounter: Payer: Self-pay | Admitting: Obstetrics & Gynecology

## 2022-07-22 VITALS — BP 118/82 | HR 102 | Ht 59.0 in | Wt 112.0 lb

## 2022-07-22 DIAGNOSIS — N3010 Interstitial cystitis (chronic) without hematuria: Secondary | ICD-10-CM | POA: Diagnosis not present

## 2022-07-22 NOTE — Progress Notes (Signed)
Diagnosed with IC: DMSO, uribel  Current Meds:  DMSO Dietary restrictions    Pt states her symptoms have been stable, no exacerbations, although not perfect Wants to continue on this cycle  Blood pressure 118/82, pulse (!) 102, height 4\' 11"  (1.499 m), weight 112 lb (50.8 kg).    The external urethra meatus was prepped with betadine DMSO 50 cc was instilled in the usual fashion after the bladder was catheterized and emptied completely 50cc was instilled into the bladder without difficulty and the patient tolerated well She will refrain from voiding as long as possible  Follow up in 8/prn weeks, or as patient requests based on her symptom complex

## 2022-09-02 ENCOUNTER — Encounter: Payer: Self-pay | Admitting: *Deleted

## 2022-11-19 ENCOUNTER — Ambulatory Visit: Payer: Medicaid Other | Admitting: Obstetrics & Gynecology

## 2022-11-28 ENCOUNTER — Ambulatory Visit: Payer: Medicaid Other | Admitting: Obstetrics & Gynecology

## 2022-12-10 ENCOUNTER — Other Ambulatory Visit: Payer: Self-pay | Admitting: Obstetrics & Gynecology

## 2023-01-09 ENCOUNTER — Encounter: Payer: Self-pay | Admitting: Obstetrics & Gynecology

## 2023-01-09 ENCOUNTER — Ambulatory Visit (INDEPENDENT_AMBULATORY_CARE_PROVIDER_SITE_OTHER): Payer: Medicaid Other | Admitting: Obstetrics & Gynecology

## 2023-01-09 VITALS — BP 107/75 | HR 84 | Ht 59.0 in | Wt 118.0 lb

## 2023-01-09 DIAGNOSIS — N3010 Interstitial cystitis (chronic) without hematuria: Secondary | ICD-10-CM

## 2023-01-09 NOTE — Progress Notes (Signed)
Diagnosed with IC: years and years ago  Current Meds:  Elmiron, DMSO Dietary restrictions    Pt states her symptoms have been stable, no exacerbations, although not perfect Wants to continue on this cycle  Blood pressure 107/75, pulse 84, height '4\' 11"'$  (1.499 m), weight 118 lb (53.5 kg).    The external urethra meatus was prepped with betadine DMSO 50 cc was instilled in the usual fashion after the bladder was catheterized and emptied completely 50cc was instilled into the bladder without difficulty and the patient tolerated well She will refrain from voiding as long as possible  Follow up in 8 weeks, or as patient requests based on her symptom complex  Florian Buff, MD 01/09/2023 3:09 PM

## 2023-01-16 ENCOUNTER — Encounter: Payer: Self-pay | Admitting: Obstetrics & Gynecology

## 2023-01-16 ENCOUNTER — Ambulatory Visit (INDEPENDENT_AMBULATORY_CARE_PROVIDER_SITE_OTHER): Payer: Medicaid Other | Admitting: Obstetrics & Gynecology

## 2023-01-16 VITALS — BP 106/72 | HR 82 | Ht 59.0 in | Wt 119.0 lb

## 2023-01-16 DIAGNOSIS — Z0000 Encounter for general adult medical examination without abnormal findings: Secondary | ICD-10-CM | POA: Diagnosis not present

## 2023-01-16 DIAGNOSIS — Z01419 Encounter for gynecological examination (general) (routine) without abnormal findings: Secondary | ICD-10-CM

## 2023-01-16 NOTE — Progress Notes (Signed)
Subjective:     Debra Lewis is a 47 y.o. female here for a routine exam.  No LMP recorded. (Menstrual status: Oral contraceptives). G1P1001 Birth Control Method:  levonorgestrol + EE Menstrual Calendar(currently): regular  Current complaints: none.   Current acute medical issues:  IC   Recent Gynecologic History No LMP recorded. (Menstrual status: Oral contraceptives). Last Pap: 2022,  normal Last mammogram: see report,    Past Medical History:  Diagnosis Date   Abnormal pap    ADHD (attention deficit hyperactivity disorder)    Anxiety    Asthma    Bacterial vaginosis    Chronic constipation    Depression    Diabetes mellitus without complication (HCC)    GERD (gastroesophageal reflux disease)    High cholesterol    History of frequent urinary tract infections    HSV-2 (herpes simplex virus 2) infection    Interstitial cystitis    Migraines    OCD (obsessive compulsive disorder)    Pityriasis rosea     Past Surgical History:  Procedure Laterality Date   conization of cervix     ESOPHAGEAL DILATION     ORIF ELBOW FRACTURE Right 10/01/2016   Procedure: RIGHT CAPITELLUM OPEN REDUCTION INTERNAL FIXATION;  Surgeon: Leanora Cover, MD;  Location: [facility];  Service: Orthopedics;  Laterality: Right;    OB History     Gravida  1   Para  1   Term  1   Preterm      AB      Living  1      SAB      IAB      Ectopic      Multiple      Live Births  1           Social History   Socioeconomic History   Marital status: Single    Spouse name: Not on file   Number of children: 1   Years of education: Not on file   Highest education level: Not on file  Occupational History   Not on file  Tobacco Use   Smoking status: Never   Smokeless tobacco: Never  Vaping Use   Vaping Use: Never used  Substance and Sexual Activity   Alcohol use: Yes    Comment: socially   Drug use: No   Sexual activity: Not Currently    Birth  control/protection: Pill  Other Topics Concern   Not on file  Social History Narrative   Not on file   Social Determinants of Health   Financial Resource Strain: Medium Risk (01/03/2022)   Overall Financial Resource Strain (CARDIA)    Difficulty of Paying Living Expenses: Somewhat hard  Food Insecurity: No Food Insecurity (01/03/2022)   Hunger Vital Sign    Worried About Running Out of Food in the Last Year: Never true    Ran Out of Food in the Last Year: Never true  Transportation Needs: No Transportation Needs (01/03/2022)   PRAPARE - Hydrologist (Medical): No    Lack of Transportation (Non-Medical): No  Physical Activity: Sufficiently Active (01/03/2022)   Exercise Vital Sign    Days of Exercise per Week: 5 days    Minutes of Exercise per Session: 30 min  Stress: Stress Concern Present (01/03/2022)   [place]    Feeling of Stress : Very much  Social Connections: Moderately Integrated (01/03/2022)   Social Connection and  Isolation Panel [NHANES]    Frequency of Communication with Friends and Family: Once a week    Frequency of Social Gatherings with Friends and Family: Twice a week    Attends Religious Services: 1 to 4 times per year    Active Member of [employer] or Organizations: Yes    Attends Music therapist: 1 to 4 times per year    Marital Status: Never married    Family History  Problem Relation Age of Onset   Breast cancer Maternal Aunt    Cancer Other    Heart disease Other    Diabetes Other    Lung disease Other    Lung cancer Sister    Diabetes Sister    Depression Mother    Arthritis Mother    Heart disease Mother    Diabetes Mother    Drug abuse Father    Stroke Father    Dementia Father    Asthma Son    Breast cancer Paternal Aunt      Current Outpatient Medications:    albuterol (PROVENTIL HFA;VENTOLIN HFA) 108 (90 BASE) MCG/ACT inhaler, Inhale 2  puffs into the lungs every 6 (six) hours as needed for wheezing. Reported on 01/17/2016, Disp: , Rfl:    ALPRAZolam (XANAX) 1 MG tablet, Take 1 tablet (1 mg total) by mouth 3 (three) times daily as needed for sleep or anxiety., Disp: 90 tablet, Rfl: 2   atorvastatin (LIPITOR) 40 MG tablet, TAKE 1 TABLET BY MOUTHIONCE A DAY., Disp: , Rfl:    cetirizine (ZYRTEC) 10 MG tablet, Take 10 mg by mouth daily., Disp: , Rfl:    Clobetasol Prop Emollient Base 0.05 % emollient cream, APPLY TO AFFECTED AREAS TWICE DAILY., Disp: 30 g, Rfl: 12   cyanocobalamin (,VITAMIN B-12,) 1000 MCG/ML injection, Inject 1,000 mcg into the muscle every 30 (thirty) days., Disp: , Rfl:    diphenhydrAMINE HCl (BENADRYL ALLERGY PO), Take by mouth as needed., Disp: , Rfl:    ELMIRON 100 MG capsule, TAKE 1 CAPSULE BY MOUTH THREE TIMES A DAY., Disp: 90 capsule, Rfl: 11   empagliflozin (JARDIANCE) 25 MG TABS tablet, Take by mouth., Disp: , Rfl:    fluconazole (DIFLUCAN) 150 MG tablet, TAKE 1 TABLET BY MOUTH TODAY, THEN REPEAT IN 3 DAYS., Disp: 2 tablet, Rfl: 11   hydrOXYzine (ATARAX/VISTARIL) 25 MG tablet, Take 25 mg by mouth daily as needed for itching. Reported on 05/09/2016, Disp: , Rfl:    ibuprofen (ADVIL,MOTRIN) 800 MG tablet, Take 800 mg by mouth daily as needed for pain (Associated with nerve damage)., Disp: , Rfl:    levonorgestrel-ethinyl estradiol (PORTIA-28) 0.15-30 MG-MCG tablet, TAKE (1) TABLET BY MOUTH ONCE DAILY AS DIRECTED., Disp: 28 tablet, Rfl: 12   Meth-Hyo-M Bl-Na Phos-Ph Sal (URIBEL) 118 MG CAPS, Take 1 capsule (118 mg total) by mouth 2 (two) times daily as needed., Disp: 120 capsule, Rfl: 11   omeprazole (PRILOSEC) 20 MG capsule, TAKE (1) CAPSULE BY MOUTH EVERY DAY., Disp: 90 capsule, Rfl: 3   RESTASIS 0.05 % ophthalmic emulsion, 1 drop 2 (two) times daily., Disp: , Rfl:    silver sulfADIAZINE (SILVADENE) 1 % cream, Apply to areas 2-3 times daily (Patient taking differently: as needed. Apply to areas 2-3 times daily),  Disp: 50 g, Rfl: 11   SYMBICORT 80-4.5 MCG/ACT inhaler, Inhale 2 puffs into the lungs 2 (two) times daily., Disp: , Rfl:    terconazole (TERAZOL 7) 0.4 % vaginal cream, INSERT ONE APPLICATORFUL PER VAGINA AT BEDTIME., Disp:  45 g, Rfl: 12   tiZANidine (ZANAFLEX) 2 MG tablet, Take 4 mg by mouth every 6 (six) hours as needed for muscle spasms., Disp: , Rfl:    triamcinolone cream (KENALOG) 0.5 %, Apply topically 2 (two) times daily., Disp: 30 g, Rfl: 11   TRINTELLIX 5 MG TABS tablet, Take 5 mg by mouth daily., Disp: , Rfl:    TRULICITY 1.5 0000000 SOPN, SMARTSIG:0.5 Milliliter(s) SUB-Q Once a Week, Disp: , Rfl:    Vitamin D, Ergocalciferol, (DRISDOL) 1.25 MG (50000 UT) CAPS capsule, Take 50,000 Units by mouth once a week., Disp: , Rfl:   Current Facility-Administered Medications:    dimethyl sulfoxide 50 % solution 50 mL, 50 mL, Urethral, Once, Florian Buff, MD  Review of Systems  Review of Systems  Constitutional: Negative for fever, chills, weight loss, malaise/fatigue and diaphoresis.  HENT: Negative for hearing loss, ear pain, nosebleeds, congestion, sore throat, neck pain, tinnitus and ear discharge.   Eyes: Negative for blurred vision, double vision, photophobia, pain, discharge and redness.  Respiratory: Negative for cough, hemoptysis, sputum production, shortness of breath, wheezing and stridor.   Cardiovascular: Negative for chest pain, palpitations, orthopnea, claudication, leg swelling and PND.  Gastrointestinal: negative for abdominal pain. Negative for heartburn, nausea, vomiting, diarrhea, constipation, blood in stool and melena.  Genitourinary: Negative for dysuria, urgency, frequency, hematuria and flank pain.  Musculoskeletal: Negative for myalgias, back pain, joint pain and falls.  Skin: Negative for itching and rash.  Neurological: Negative for dizziness, tingling, tremors, sensory change, speech change, focal weakness, seizures, loss of consciousness, weakness and  headaches.  Endo/Heme/Allergies: Negative for environmental allergies and polydipsia. Does not bruise/bleed easily.  Psychiatric/Behavioral: Negative for depression, suicidal ideas, hallucinations, memory loss and substance abuse. The patient is not nervous/anxious and does not have insomnia.        Objective:  Blood pressure 106/72, pulse 82, height 4\' 11"  (1.499 m), weight 119 lb (54 kg).   Physical Exam  Vitals reviewed. Constitutional: She is oriented to person, place, and time. She appears well-developed and well-nourished.  HENT:  Head: Normocephalic and atraumatic.        Right Ear: External ear normal.  Left Ear: External ear normal.  Nose: Nose normal.  Mouth/Throat: Oropharynx is clear and moist.  Eyes: Conjunctivae and EOM are normal. Pupils are equal, round, and reactive to light. Right eye exhibits no discharge. Left eye exhibits no discharge. No scleral icterus.  Neck: Normal range of motion. Neck supple. No tracheal deviation present. No thyromegaly present.  Cardiovascular: Normal rate, regular rhythm, normal heart sounds and intact distal pulses.  Exam reveals no gallop and no friction rub.   No murmur heard. Respiratory: Effort normal and breath sounds normal. No respiratory distress. She has no wheezes. She has no rales. She exhibits no tenderness.  GI: Soft. Bowel sounds are normal. She exhibits no distension and no mass. There is no tenderness. There is no rebound and no guarding.  Genitourinary:  Breasts no masses skin changes or nipple changes bilaterally      Vulva is normal without lesions Vagina is pink moist without discharge Cervix normal in appearance and pap is done Uterus is normal size shape and contour Adnexa is negative with normal sized ovaries   Musculoskeletal: Normal range of motion. She exhibits no edema and no tenderness.  Neurological: She is alert and oriented to person, place, and time. She has normal reflexes. She displays normal reflexes.  No cranial nerve deficit. She exhibits normal muscle tone.  Coordination normal.  Skin: Skin is warm and dry. No rash noted. No erythema. No pallor.  Psychiatric: She has a normal mood and affect. Her behavior is normal. Judgment and thought content normal.       Medications Ordered at today's visit: No orders of the defined types were placed in this encounter.   Other orders placed at today's visit: No orders of the defined types were placed in this encounter.     Assessment:    Normal Gyn exam.    Plan:       Pap next year No follow-ups on file.

## 2023-01-28 ENCOUNTER — Encounter: Payer: Self-pay | Admitting: *Deleted

## 2023-02-11 ENCOUNTER — Other Ambulatory Visit: Payer: Self-pay | Admitting: Obstetrics & Gynecology

## 2023-03-06 ENCOUNTER — Ambulatory Visit: Payer: Medicaid Other | Admitting: Obstetrics & Gynecology

## 2023-03-13 ENCOUNTER — Encounter (HOSPITAL_COMMUNITY): Payer: Self-pay | Admitting: Internal Medicine

## 2023-03-13 DIAGNOSIS — Z1231 Encounter for screening mammogram for malignant neoplasm of breast: Secondary | ICD-10-CM

## 2023-03-16 ENCOUNTER — Other Ambulatory Visit: Payer: Self-pay | Admitting: Obstetrics & Gynecology

## 2023-03-17 ENCOUNTER — Encounter (HOSPITAL_COMMUNITY): Payer: Self-pay | Admitting: Internal Medicine

## 2023-03-17 ENCOUNTER — Other Ambulatory Visit (HOSPITAL_COMMUNITY): Payer: Self-pay | Admitting: Family Medicine

## 2023-03-17 DIAGNOSIS — Z1231 Encounter for screening mammogram for malignant neoplasm of breast: Secondary | ICD-10-CM

## 2023-03-18 ENCOUNTER — Encounter: Payer: Self-pay | Admitting: Obstetrics & Gynecology

## 2023-03-18 ENCOUNTER — Ambulatory Visit (INDEPENDENT_AMBULATORY_CARE_PROVIDER_SITE_OTHER): Payer: Medicaid Other | Admitting: Obstetrics & Gynecology

## 2023-03-18 VITALS — BP 100/72 | HR 94 | Ht 59.0 in | Wt 120.0 lb

## 2023-03-18 DIAGNOSIS — N3010 Interstitial cystitis (chronic) without hematuria: Secondary | ICD-10-CM | POA: Diagnosis not present

## 2023-03-18 MED ORDER — OMEPRAZOLE 20 MG PO CPDR: DELAYED_RELEASE_CAPSULE | ORAL | 3 refills | Status: AC

## 2023-03-18 MED ORDER — URIBEL 118 MG PO CAPS: 1.0000 | ORAL_CAPSULE | Freq: Two times a day (BID) | ORAL | 11 refills | Status: DC | PRN

## 2023-03-18 MED ORDER — FLUCONAZOLE 150 MG PO TABS
ORAL_TABLET | ORAL | 11 refills | Status: AC
Start: 2023-03-18 — End: ?

## 2023-03-18 MED ORDER — LEVONORGESTREL-ETHINYL ESTRAD 0.15-30 MG-MCG PO TABS: ORAL_TABLET | ORAL | 3 refills | Status: DC

## 2023-03-18 NOTE — Progress Notes (Addendum)
Diagnosed with IC: many years  Current Meds:  Elmiron + DMSO Dietary restrictions    Pt states her symptoms have been stable, no exacerbations, although not perfect Wants to continue on this cycle  Blood pressure 100/72, pulse 94, height 4\' 11"  (1.499 m), weight 120 lb (54.4 kg).    The external urethra meatus was prepped with betadine DMSO 50 cc was instilled in the usual fashion after the bladder was catheterized and emptied completely 50cc was instilled into the bladder without difficulty and the patient tolerated well She will refrain from voiding as long as possible  Follow up in 8 weeks, or as patient requests based on her symptom complex   Meds ordered this encounter  Medications   levonorgestrel-ethinyl estradiol (PORTIA-28) 0.15-30 MG-MCG tablet    Sig: TAKE (1) TABLET BY MOUTH ONCE DAILY AS DIRECTED.    Dispense:  84 tablet    Refill:  3   fluconazole (DIFLUCAN) 150 MG tablet    Sig: As directed    Dispense:  2 tablet    Refill:  11   Meth-Hyo-M Bl-Na Phos-Ph Sal (URIBEL) 118 MG CAPS    Sig: Take 1 capsule (118 mg total) by mouth 2 (two) times daily as needed.    Dispense:  120 capsule    Refill:  11   omeprazole (PRILOSEC) 20 MG capsule    Sig: TAKE (1) CAPSULE BY MOUTH EVERY DAY.    Dispense:  90 capsule    Refill:  3

## 2023-03-18 NOTE — Addendum Note (Signed)
Addended by: Lazaro Arms on: 03/18/2023 10:49 AM   Modules accepted: Orders

## 2023-04-14 ENCOUNTER — Ambulatory Visit (HOSPITAL_COMMUNITY)
Admission: RE | Admit: 2023-04-14 | Discharge: 2023-04-14 | Disposition: A | Discharge status: Home / Self Care | Payer: Medicaid Other | Source: Ambulatory Visit | Attending: Family Medicine | Admitting: Family Medicine

## 2023-04-14 DIAGNOSIS — Z1231 Encounter for screening mammogram for malignant neoplasm of breast: Secondary | ICD-10-CM | POA: Diagnosis present

## 2023-05-14 ENCOUNTER — Ambulatory Visit: Payer: MEDICAID | Admitting: Obstetrics & Gynecology

## 2023-05-29 ENCOUNTER — Encounter: Payer: Self-pay | Admitting: Obstetrics & Gynecology

## 2023-05-29 ENCOUNTER — Ambulatory Visit: Payer: MEDICAID | Admitting: Obstetrics & Gynecology

## 2023-05-29 VITALS — BP 107/73 | HR 82 | Ht 59.0 in | Wt 121.0 lb

## 2023-05-29 DIAGNOSIS — N3010 Interstitial cystitis (chronic) without hematuria: Secondary | ICD-10-CM | POA: Diagnosis not present

## 2023-05-29 NOTE — Progress Notes (Signed)
Diagnosed with IC: years ago  Current Meds:  Elmiron + DMSO Dietary restrictions    Pt states her symptoms have been stable, no exacerbations, although not perfect Wants to continue on this cycle  Blood pressure 107/73, pulse 82, height 4\' 11"  (1.499 m), weight 121 lb (54.9 kg).    The external urethra meatus was prepped with betadine DMSO 50 cc was instilled in the usual fashion after the bladder was catheterized and emptied completely 50cc was instilled into the bladder without difficulty and the patient tolerated well She will refrain from voiding as long as possible  Follow up in 8 weeks, or as patient requests based on her symptom complex

## 2023-07-24 ENCOUNTER — Ambulatory Visit: Payer: MEDICAID | Admitting: Obstetrics & Gynecology

## 2023-07-31 ENCOUNTER — Ambulatory Visit: Payer: MEDICAID | Admitting: Obstetrics & Gynecology

## 2023-07-31 ENCOUNTER — Encounter: Payer: Self-pay | Admitting: *Deleted

## 2023-07-31 ENCOUNTER — Encounter: Payer: Self-pay | Admitting: Obstetrics & Gynecology

## 2023-07-31 VITALS — BP 107/71 | HR 90 | Ht 59.0 in | Wt 120.0 lb

## 2023-07-31 DIAGNOSIS — N3010 Interstitial cystitis (chronic) without hematuria: Secondary | ICD-10-CM | POA: Diagnosis not present

## 2023-07-31 NOTE — Progress Notes (Signed)
Diagnosed with IC: approx 2007  Current Meds:  Elmiron, DMSO Dietary restrictions    Pt states her symptoms have been stable, no exacerbations, although not perfect Wants to continue on this cycle  Blood pressure 107/71, pulse 90, height 4\' 11"  (1.499 m), weight 120 lb (54.4 kg).    The external urethra meatus was prepped with betadine DMSO 50 cc was instilled in the usual fashion after the bladder was catheterized and emptied completely 50cc was instilled into the bladder without difficulty and the patient tolerated well She will refrain from voiding as long as possible  Follow up in 8 weeks, or as patient requests based on her symptom complex

## 2023-09-30 ENCOUNTER — Ambulatory Visit: Payer: MEDICAID | Admitting: Obstetrics & Gynecology

## 2023-09-30 ENCOUNTER — Encounter: Payer: Self-pay | Admitting: Obstetrics & Gynecology

## 2023-09-30 VITALS — BP 111/74 | HR 88 | Ht 59.0 in | Wt 121.0 lb

## 2023-09-30 DIAGNOSIS — N3010 Interstitial cystitis (chronic) without hematuria: Secondary | ICD-10-CM | POA: Diagnosis not present

## 2023-09-30 MED ORDER — SILVER SULFADIAZINE 1 % EX CREA: TOPICAL_CREAM | CUTANEOUS | 11 refills | Status: DC

## 2023-09-30 NOTE — Progress Notes (Signed)
Diagnosed with IC: 2007  Current Meds:  DMSO irrigations Dietary restrictions    Pt states her symptoms have been stable, no exacerbations, although not perfect Wants to continue on this cycle  Blood pressure 111/74, pulse 88, height 4\' 11"  (1.499 m), weight 121 lb (54.9 kg).    The external urethra meatus was prepped with betadine DMSO 50 cc was instilled in the usual fashion after the bladder was catheterized and emptied completely 50cc was instilled into the bladder without difficulty and the patient tolerated well She will refrain from voiding as long as possible  Follow up in 8 weeks, or as patient requests based on her symptom complex

## 2023-11-24 ENCOUNTER — Ambulatory Visit: Payer: MEDICAID | Admitting: Obstetrics & Gynecology

## 2023-12-09 ENCOUNTER — Ambulatory Visit: Payer: MEDICAID | Admitting: Obstetrics & Gynecology

## 2023-12-15 ENCOUNTER — Ambulatory Visit: Payer: MEDICAID | Admitting: Obstetrics & Gynecology

## 2023-12-15 ENCOUNTER — Encounter: Payer: Self-pay | Admitting: Obstetrics & Gynecology

## 2023-12-15 VITALS — BP 110/71 | HR 82 | Ht 59.0 in | Wt 121.4 lb

## 2023-12-15 DIAGNOSIS — N3010 Interstitial cystitis (chronic) without hematuria: Secondary | ICD-10-CM | POA: Diagnosis not present

## 2023-12-15 NOTE — Progress Notes (Signed)
 Diagnosed with IC: 2007   Current Meds:  Elmiron + DMSO irrigations Dietary restrictions    Pt states her symptoms have been stable, no exacerbations, although not perfect Wants to continue on this cycle  Blood pressure 110/71, pulse 82, height 4\' 11"  (1.499 m), weight 121 lb 6.4 oz (55.1 kg).    The external urethra meatus was prepped with betadine DMSO 50 cc was instilled in the usual fashion after the bladder was catheterized and emptied completely 50cc was instilled into the bladder without difficulty and the patient tolerated well She will refrain from voiding as long as possible  Follow up in 8 weeks, or as patient requests based on her symptom complex

## 2024-01-22 ENCOUNTER — Ambulatory Visit: Payer: MEDICAID | Admitting: Obstetrics & Gynecology

## 2024-01-22 ENCOUNTER — Encounter: Payer: Self-pay | Admitting: Obstetrics & Gynecology

## 2024-01-22 ENCOUNTER — Other Ambulatory Visit (HOSPITAL_COMMUNITY)
Admission: RE | Admit: 2024-01-22 | Discharge: 2024-01-22 | Disposition: A | Discharge status: Home / Self Care | Payer: MEDICAID | Source: Ambulatory Visit | Attending: Obstetrics & Gynecology | Admitting: Obstetrics & Gynecology

## 2024-01-22 VITALS — BP 109/74 | HR 76 | Ht 59.0 in | Wt 122.0 lb

## 2024-01-22 DIAGNOSIS — Z01419 Encounter for gynecological examination (general) (routine) without abnormal findings: Secondary | ICD-10-CM | POA: Insufficient documentation

## 2024-01-22 NOTE — Progress Notes (Signed)
 Subjective:     Debra Lewis is a 48 y.o. female here for a routine exam.  No LMP recorded. (Menstrual status: Oral contraceptives). G1P1001 Birth Control Method:  COC Menstrual Calendar(currently): regular  Current complaints: none.   Current acute medical issues:  IC   Recent Gynecologic History No LMP recorded. (Menstrual status: Oral contraceptives). Last P2021ap: ,  normal Last mammogram: 2024,  normal  Past Medical History:  Diagnosis Date   Abnormal pap    ADHD (attention deficit hyperactivity disorder)    Anxiety    Asthma    Bacterial vaginosis    Chronic constipation    Depression    Diabetes mellitus without complication (HCC)    GERD (gastroesophageal reflux disease)    High cholesterol    History of frequent urinary tract infections    HSV-2 (herpes simplex virus 2) infection    Interstitial cystitis    Migraines    OCD (obsessive compulsive disorder)    Pityriasis rosea     Past Surgical History:  Procedure Laterality Date   conization of cervix     ESOPHAGEAL DILATION     ORIF ELBOW FRACTURE Right 10/01/2016   Procedure: RIGHT CAPITELLUM OPEN REDUCTION INTERNAL FIXATION;  Surgeon: Betha Loa, MD;  Location: [facility];  Service: Orthopedics;  Laterality: Right;    OB History     Gravida  1   Para  1   Term  1   Preterm      AB      Living  1      SAB      IAB      Ectopic      Multiple      Live Births  1           Social History   Socioeconomic History   Marital status: Single    Spouse name: Not on file   Number of children: 1   Years of education: Not on file   Highest education level: Not on file  Occupational History   Not on file  Tobacco Use   Smoking status: Never   Smokeless tobacco: Never  Vaping Use   Vaping status: Never Used  Substance and Sexual Activity   Alcohol use: Yes    Comment: socially   Drug use: No   Sexual activity: Not Currently    Birth control/protection: Pill   Other Topics Concern   Not on file  Social History Narrative   Not on file   Social Drivers of Health   Financial Resource Strain: Medium Risk (01/03/2022)   Overall Financial Resource Strain (CARDIA)    Difficulty of Paying Living Expenses: Somewhat hard  Food Insecurity: No Food Insecurity (01/03/2022)   Hunger Vital Sign    Worried About Running Out of Food in the Last Year: Never true    Ran Out of Food in the Last Year: Never true  Transportation Needs: No Transportation Needs (01/03/2022)   PRAPARE - Administrator, Civil Service (Medical): No    Lack of Transportation (Non-Medical): No  Physical Activity: Sufficiently Active (01/03/2022)   Exercise Vital Sign    Days of Exercise per Week: 5 days    Minutes of Exercise per Session: 30 min  Stress: Stress Concern Present (01/03/2022)   [employer] of Occupational Health - Occupational Stress Questionnaire    Feeling of Stress : Very much  Social Connections: Moderately Integrated (01/03/2022)   Social Connection and Isolation Panel [NHANES]  Frequency of Communication with Friends and Family: Once a week    Frequency of Social Gatherings with Friends and Family: Twice a week    Attends Religious Services: 1 to 4 times per year    Active Member of [employer] or Organizations: Yes    Attends Engineer, structural: 1 to 4 times per year    Marital Status: Never married    Family History  Problem Relation Age of Onset   Breast cancer Maternal Aunt    Cancer Other    Heart disease Other    Diabetes Other    Lung disease Other    Lung cancer Sister    Diabetes Sister    Depression Mother    Arthritis Mother    Heart disease Mother    Diabetes Mother    Drug abuse Father    Stroke Father    Dementia Father    Asthma Son    Breast cancer Paternal Aunt      Current Outpatient Medications:    albuterol (PROVENTIL HFA;VENTOLIN HFA) 108 (90 BASE) MCG/ACT inhaler, Inhale 2 puffs into the lungs every 6  (six) hours as needed for wheezing. Reported on 01/17/2016, Disp: , Rfl:    ALPRAZolam (XANAX) 1 MG tablet, Take 1 tablet (1 mg total) by mouth 3 (three) times daily as needed for sleep or anxiety., Disp: 90 tablet, Rfl: 2   atorvastatin (LIPITOR) 40 MG tablet, TAKE 1 TABLET BY MOUTHIONCE A DAY., Disp: , Rfl:    cetirizine (ZYRTEC) 10 MG tablet, Take 10 mg by mouth daily., Disp: , Rfl:    Clobetasol Prop Emollient Base 0.05 % emollient cream, APPLY TO AFFECTED AREAS TWICE DAILY., Disp: 30 g, Rfl: 12   cyanocobalamin (,VITAMIN B-12,) 1000 MCG/ML injection, Inject 1,000 mcg into the muscle every 30 (thirty) days., Disp: , Rfl:    diphenhydrAMINE HCl (BENADRYL ALLERGY PO), Take by mouth as needed., Disp: , Rfl:    ELMIRON 100 MG capsule, TAKE 1 CAPSULE BY MOUTH THREE TIMES A DAY., Disp: 90 capsule, Rfl: 11   empagliflozin (JARDIANCE) 25 MG TABS tablet, Take by mouth., Disp: , Rfl:    fluconazole (DIFLUCAN) 150 MG tablet, As directed, Disp: 2 tablet, Rfl: 11   hydrOXYzine (ATARAX/VISTARIL) 25 MG tablet, Take 25 mg by mouth daily as needed for itching. Reported on 05/09/2016, Disp: , Rfl:    ibuprofen (ADVIL,MOTRIN) 800 MG tablet, Take 800 mg by mouth daily as needed for pain (Associated with nerve damage)., Disp: , Rfl:    levonorgestrel-ethinyl estradiol (PORTIA-28) 0.15-30 MG-MCG tablet, TAKE (1) TABLET BY MOUTH ONCE DAILY AS DIRECTED., Disp: 84 tablet, Rfl: 3   Meth-Hyo-M Bl-Na Phos-Ph Sal (URIBEL) 118 MG CAPS, Take 1 capsule (118 mg total) by mouth 2 (two) times daily as needed., Disp: 120 capsule, Rfl: 11   RESTASIS 0.05 % ophthalmic emulsion, 1 drop 2 (two) times daily., Disp: , Rfl:    silver sulfADIAZINE (SILVADENE) 1 % cream, Apply to areas 2-3 times daily, Disp: 50 g, Rfl: 11   SYMBICORT 80-4.5 MCG/ACT inhaler, Inhale 2 puffs into the lungs 2 (two) times daily., Disp: , Rfl:    terconazole (TERAZOL 7) 0.4 % vaginal cream, INSERT ONE APPLICATORFUL PER VAGINA AT BEDTIME., Disp: 45 g, Rfl: 12    tiZANidine (ZANAFLEX) 2 MG tablet, Take 4 mg by mouth every 6 (six) hours as needed for muscle spasms., Disp: , Rfl:    triamcinolone cream (KENALOG) 0.5 %, Apply topically 2 (two) times daily., Disp: 30 g,  Rfl: 11   TRINTELLIX 5 MG TABS tablet, Take 5 mg by mouth daily., Disp: , Rfl:    TRULICITY 1.5 MG/0.5ML SOPN, SMARTSIG:0.5 Milliliter(s) SUB-Q Once a Week, Disp: , Rfl:    Vitamin D, Ergocalciferol, (DRISDOL) 1.25 MG (50000 UT) CAPS capsule, Take 50,000 Units by mouth once a week., Disp: , Rfl:    omeprazole (PRILOSEC) 20 MG capsule, TAKE (1) CAPSULE BY MOUTH EVERY DAY. (Patient taking differently: Take 40 mg by mouth daily. TAKE (1) CAPSULE BY MOUTH EVERY DAY.), Disp: 90 capsule, Rfl: 3  Current Facility-Administered Medications:    dimethyl sulfoxide 50 % solution 50 mL, 50 mL, Urethral, Once, Lazaro Arms, MD  Review of Systems  Review of Systems  Constitutional: Negative for fever, chills, weight loss, malaise/fatigue and diaphoresis.  HENT: Negative for hearing loss, ear pain, nosebleeds, congestion, sore throat, neck pain, tinnitus and ear discharge.   Eyes: Negative for blurred vision, double vision, photophobia, pain, discharge and redness.  Respiratory: Negative for cough, hemoptysis, sputum production, shortness of breath, wheezing and stridor.   Cardiovascular: Negative for chest pain, palpitations, orthopnea, claudication, leg swelling and PND.  Gastrointestinal: negative for abdominal pain. Negative for heartburn, nausea, vomiting, diarrhea, constipation, blood in stool and melena.  Genitourinary: Negative for dysuria, urgency, frequency, hematuria and flank pain.  Musculoskeletal: Negative for myalgias, back pain, joint pain and falls.  Skin: Negative for itching and rash.  Neurological: Negative for dizziness, tingling, tremors, sensory change, speech change, focal weakness, seizures, loss of consciousness, weakness and headaches.  Endo/Heme/Allergies: Negative for  environmental allergies and polydipsia. Does not bruise/bleed easily.  Psychiatric/Behavioral: Negative for depression, suicidal ideas, hallucinations, memory loss and substance abuse. The patient is not nervous/anxious and does not have insomnia.        Objective:  Blood pressure 109/74, pulse 76, height 4\' 11"  (1.499 m), weight 122 lb (55.3 kg).   Physical Exam  Vitals reviewed. Constitutional: She is oriented to person, place, and time. She appears well-developed and well-nourished.  HENT:  Head: Normocephalic and atraumatic.        Right Ear: External ear normal.  Left Ear: External ear normal.  Nose: Nose normal.  Mouth/Throat: Oropharynx is clear and moist.  Eyes: Conjunctivae and EOM are normal. Pupils are equal, round, and reactive to light. Right eye exhibits no discharge. Left eye exhibits no discharge. No scleral icterus.  Neck: Normal range of motion. Neck supple. No tracheal deviation present. No thyromegaly present.  Cardiovascular: Normal rate, regular rhythm, normal heart sounds and intact distal pulses.  Exam reveals no gallop and no friction rub.   No murmur heard. Respiratory: Effort normal and breath sounds normal. No respiratory distress. She has no wheezes. She has no rales. She exhibits no tenderness.  GI: Soft. Bowel sounds are normal. She exhibits no distension and no mass. There is no tenderness. There is no rebound and no guarding.  Genitourinary:  Breasts no masses skin changes or nipple changes bilaterally      Vulva is normal without lesions Vagina is pink moist without discharge Cervix normal in appearance and pap is done Uterus is normal size shape and contour Adnexa is negative with normal sized ovaries   Musculoskeletal: Normal range of motion. She exhibits no edema and no tenderness.  Neurological: She is alert and oriented to person, place, and time. She has normal reflexes. She displays normal reflexes. No cranial nerve deficit. She exhibits normal  muscle tone. Coordination normal.  Skin: Skin is warm and dry. No  rash noted. No erythema. No pallor.  Psychiatric: She has a normal mood and affect. Her behavior is normal. Judgment and thought content normal.       Medications Ordered at today's visit: No orders of the defined types were placed in this encounter.   Other orders placed at today's visit: No orders of the defined types were placed in this encounter.    ASSESSMENT + PLAN:    ICD-10-CM   1. Well woman exam with routine gynecological exam  Z01.419     2. Encounter for gynecological examination with Papanicolaou smear of cervix  Z01.419 Cytology - PAP( [place])          Return for keep scheduled.

## 2024-01-27 LAB — CYTOLOGY - PAP
Chlamydia: NEGATIVE
Comment: NEGATIVE
Comment: NEGATIVE
Comment: NEGATIVE
Comment: NEGATIVE
Comment: NORMAL
Diagnosis: NEGATIVE
Diagnosis: REACTIVE
HPV 16: NEGATIVE
HPV 18 / 45: NEGATIVE
High risk HPV: POSITIVE — AB
Neisseria Gonorrhea: NEGATIVE

## 2024-01-28 ENCOUNTER — Encounter: Payer: Self-pay | Admitting: Obstetrics & Gynecology

## 2024-02-05 ENCOUNTER — Other Ambulatory Visit: Payer: Self-pay | Admitting: *Deleted

## 2024-02-05 MED ORDER — LEVONORGESTREL-ETHINYL ESTRAD 0.15-30 MG-MCG PO TABS: ORAL_TABLET | ORAL | 3 refills | Status: AC

## 2024-02-09 ENCOUNTER — Encounter: Payer: Self-pay | Admitting: Obstetrics & Gynecology

## 2024-02-09 ENCOUNTER — Ambulatory Visit: Payer: MEDICAID | Admitting: Obstetrics & Gynecology

## 2024-02-09 ENCOUNTER — Other Ambulatory Visit: Payer: Self-pay | Admitting: Obstetrics & Gynecology

## 2024-02-09 VITALS — BP 118/72 | HR 80 | Ht 59.0 in | Wt 120.0 lb

## 2024-02-09 DIAGNOSIS — N3010 Interstitial cystitis (chronic) without hematuria: Secondary | ICD-10-CM | POA: Diagnosis not present

## 2024-02-09 NOTE — Progress Notes (Signed)
 Diagnosed with IC: 2007  Current Meds:  DMSO q8 weeks Dietary restrictions    Pt states her symptoms have been stable, no exacerbations, although not perfect Wants to continue on this cycle  Blood pressure 118/72, pulse 80, height 4\' 11"  (1.499 m), weight 120 lb (54.4 kg).    The external urethra meatus was prepped with betadine DMSO 50 cc was instilled in the usual fashion after the bladder was catheterized and emptied completely 50cc was instilled into the bladder without difficulty and the patient tolerated well She will refrain from voiding as long as possible  Follow up in 8 weeks, or as patient requests based on her symptom complex

## 2024-03-04 ENCOUNTER — Other Ambulatory Visit (HOSPITAL_COMMUNITY): Payer: Self-pay | Admitting: Internal Medicine

## 2024-03-04 DIAGNOSIS — Z1231 Encounter for screening mammogram for malignant neoplasm of breast: Secondary | ICD-10-CM

## 2024-04-06 ENCOUNTER — Encounter: Payer: Self-pay | Admitting: Obstetrics & Gynecology

## 2024-04-06 ENCOUNTER — Ambulatory Visit: Payer: MEDICAID | Admitting: Obstetrics & Gynecology

## 2024-04-06 VITALS — BP 115/80 | Ht 59.0 in | Wt 122.0 lb

## 2024-04-06 DIAGNOSIS — N3010 Interstitial cystitis (chronic) without hematuria: Secondary | ICD-10-CM

## 2024-04-06 NOTE — Progress Notes (Signed)
 Diagnosed with IC: 2007  Current Meds:  Elmiron , DMSO Dietary restrictions    Pt states her symptoms have been stable, no exacerbations, although not perfect Wants to continue on this cycle  Blood pressure 115/80, height 4\' 11"  (1.499 m), weight 122 lb (55.3 kg).    The external urethra meatus was prepped with betadine DMSO 50 cc was instilled in the usual fashion after the bladder was catheterized and emptied completely 50cc was instilled into the bladder without difficulty and the patient tolerated well She will refrain from voiding as long as possible  Follow up in 8 weeks, or as patient requests based on her symptom complex

## 2024-04-12 ENCOUNTER — Other Ambulatory Visit: Payer: Self-pay | Admitting: Obstetrics & Gynecology

## 2024-04-14 ENCOUNTER — Ambulatory Visit (HOSPITAL_COMMUNITY)
Admission: RE | Admit: 2024-04-14 | Discharge: 2024-04-14 | Disposition: A | Discharge status: Home / Self Care | Payer: MEDICAID | Source: Ambulatory Visit | Attending: Internal Medicine | Admitting: Internal Medicine

## 2024-04-14 DIAGNOSIS — Z1231 Encounter for screening mammogram for malignant neoplasm of breast: Secondary | ICD-10-CM | POA: Diagnosis present

## 2024-05-25 ENCOUNTER — Ambulatory Visit: Payer: MEDICAID | Admitting: Obstetrics & Gynecology

## 2024-06-15 ENCOUNTER — Encounter (INDEPENDENT_AMBULATORY_CARE_PROVIDER_SITE_OTHER): Payer: Self-pay | Admitting: *Deleted

## 2024-06-17 ENCOUNTER — Ambulatory Visit: Payer: MEDICAID | Admitting: Obstetrics & Gynecology

## 2024-06-24 ENCOUNTER — Ambulatory Visit: Payer: MEDICAID | Admitting: Obstetrics & Gynecology

## 2024-06-29 ENCOUNTER — Ambulatory Visit: Payer: MEDICAID | Admitting: Obstetrics & Gynecology

## 2024-06-29 VITALS — BP 116/79 | HR 94 | Ht 59.0 in | Wt 125.0 lb

## 2024-06-29 DIAGNOSIS — N3010 Interstitial cystitis (chronic) without hematuria: Secondary | ICD-10-CM

## 2024-06-29 NOTE — Progress Notes (Signed)
 Diagnosed with IC: 2007  Current Meds:  DMSO, elmiron  Dietary restrictions    Pt states her symptoms have been stable, no exacerbations, although not perfect Wants to continue on this cycle  Blood pressure 116/79, pulse 94, height 4' 11 (1.499 m), weight 125 lb (56.7 kg).    The external urethra meatus was prepped with betadine DMSO 50 cc was instilled in the usual fashion after the bladder was catheterized and emptied completely 50cc was instilled into the bladder without difficulty and the patient tolerated well She will refrain from voiding as long as possible  Follow up in 8 weeks, or as patient requests based on her symptom complex

## 2024-08-19 ENCOUNTER — Other Ambulatory Visit: Payer: Self-pay | Admitting: Obstetrics & Gynecology

## 2024-08-20 ENCOUNTER — Ambulatory Visit (INDEPENDENT_AMBULATORY_CARE_PROVIDER_SITE_OTHER): Payer: MEDICAID | Admitting: Obstetrics & Gynecology

## 2024-08-20 ENCOUNTER — Encounter: Payer: Self-pay | Admitting: Obstetrics & Gynecology

## 2024-08-20 VITALS — BP 128/83 | HR 82 | Ht 59.0 in | Wt 128.0 lb

## 2024-08-20 DIAGNOSIS — N3010 Interstitial cystitis (chronic) without hematuria: Secondary | ICD-10-CM

## 2024-08-20 NOTE — Progress Notes (Signed)
 Diagnosed with IC: 2007  Current Meds:  Elmiron  DMSO Dietary restrictions    Pt states her symptoms have been stable, no exacerbations, although not perfect Wants to continue on this cycle  Blood pressure 128/83, pulse 82, height 4' 11 (1.499 m), weight 128 lb (58.1 kg).    The external urethra meatus was prepped with betadine DMSO 50 cc was instilled in the usual fashion after the bladder was catheterized and emptied completely 50cc was instilled into the bladder without difficulty and the patient tolerated well She will refrain from voiding as long as possible  Follow up in 8 weeks, or as patient requests based on her symptom complex

## 2024-10-18 ENCOUNTER — Encounter: Payer: Self-pay | Admitting: Obstetrics & Gynecology

## 2024-10-18 ENCOUNTER — Ambulatory Visit: Payer: MEDICAID | Admitting: Obstetrics & Gynecology

## 2024-10-18 VITALS — BP 120/86 | HR 101 | Ht 59.0 in | Wt 125.0 lb

## 2024-10-18 DIAGNOSIS — N3010 Interstitial cystitis (chronic) without hematuria: Secondary | ICD-10-CM

## 2024-10-18 NOTE — Progress Notes (Signed)
 Diagnosed with IC: 2007  Current Meds:  Elmiron , DMSO Dietary restrictions    Pt states her symptoms have been stable, no exacerbations, although not perfect Wants to continue on this cycle  Blood pressure 120/86, pulse (!) 101, height 4' 11 (1.499 m), weight 125 lb (56.7 kg).    The external urethra meatus was prepped with betadine DMSO 50 cc was instilled in the usual fashion after the bladder was catheterized and emptied completely 50cc was instilled into the bladder without difficulty and the patient tolerated well She will refrain from voiding as long as possible  Follow up in 8 weeks, or as patient requests based on her symptom complex

## 2024-11-17 ENCOUNTER — Other Ambulatory Visit: Payer: Self-pay | Admitting: Obstetrics & Gynecology

## 2024-12-10 ENCOUNTER — Ambulatory Visit: Payer: MEDICAID | Admitting: Obstetrics & Gynecology
